# Patient Record
Sex: Female | Born: 1975 | Race: White | Hispanic: No | Marital: Married | Smoking: Never smoker
Health system: Southern US, Community
[De-identification: ages and names within clinical notes are randomized; demographics above are authoritative.]

## PROBLEM LIST (undated history)

## (undated) DIAGNOSIS — F32A Depression, unspecified: Secondary | ICD-10-CM

## (undated) DIAGNOSIS — F419 Anxiety disorder, unspecified: Secondary | ICD-10-CM

## (undated) DIAGNOSIS — F329 Major depressive disorder, single episode, unspecified: Secondary | ICD-10-CM

## (undated) DIAGNOSIS — G43909 Migraine, unspecified, not intractable, without status migrainosus: Secondary | ICD-10-CM

---

## 2014-04-07 ENCOUNTER — Observation Stay: Payer: Self-pay | Admitting: Surgery

## 2014-04-07 LAB — CBC WITH DIFFERENTIAL/PLATELET
Basophil #: 0.1 10*3/uL (ref 0.0–0.1)
Basophil %: 0.8 %
EOS ABS: 0.1 10*3/uL (ref 0.0–0.7)
EOS PCT: 1.6 %
HCT: 43.2 % (ref 35.0–47.0)
HGB: 14.1 g/dL (ref 12.0–16.0)
Lymphocyte #: 2.1 10*3/uL (ref 1.0–3.6)
Lymphocyte %: 27.1 %
MCH: 27.6 pg (ref 26.0–34.0)
MCHC: 32.6 g/dL (ref 32.0–36.0)
MCV: 85 fL (ref 80–100)
Monocyte #: 0.8 x10 3/mm (ref 0.2–0.9)
Monocyte %: 9.6 %
Neutrophil #: 4.8 10*3/uL (ref 1.4–6.5)
Neutrophil %: 60.9 %
PLATELETS: 273 10*3/uL (ref 150–440)
RBC: 5.1 10*6/uL (ref 3.80–5.20)
RDW: 15.7 % — ABNORMAL HIGH (ref 11.5–14.5)
WBC: 7.9 10*3/uL (ref 3.6–11.0)

## 2014-04-07 LAB — URINALYSIS, COMPLETE
Bilirubin,UR: NEGATIVE
Glucose,UR: NEGATIVE mg/dL (ref 0–75)
LEUKOCYTE ESTERASE: NEGATIVE
Nitrite: NEGATIVE
Ph: 5 (ref 4.5–8.0)
Protein: 30
Specific Gravity: 1.02 (ref 1.003–1.030)
Squamous Epithelial: 1
WBC UR: 2 /HPF (ref 0–5)

## 2014-04-07 LAB — COMPREHENSIVE METABOLIC PANEL
AST: 18 U/L (ref 15–37)
Albumin: 3.7 g/dL (ref 3.4–5.0)
Alkaline Phosphatase: 99 U/L
Anion Gap: 5 — ABNORMAL LOW (ref 7–16)
BUN: 9 mg/dL (ref 7–18)
Bilirubin,Total: 0.3 mg/dL (ref 0.2–1.0)
CO2: 24 mmol/L (ref 21–32)
Calcium, Total: 8.8 mg/dL (ref 8.5–10.1)
Chloride: 110 mmol/L — ABNORMAL HIGH (ref 98–107)
Creatinine: 0.82 mg/dL (ref 0.60–1.30)
EGFR (African American): >60
Glucose: 91 mg/dL (ref 65–99)
Osmolality: 276 (ref 275–301)
Potassium: 3.5 mmol/L (ref 3.5–5.1)
SGPT (ALT): 31 U/L (ref 12–78)
Sodium: 139 mmol/L (ref 136–145)
Total Protein: 8 g/dL (ref 6.4–8.2)

## 2014-04-08 LAB — CBC WITH DIFFERENTIAL/PLATELET
BASOS ABS: 0 10*3/uL (ref 0.0–0.1)
BASOS PCT: 0.5 %
Eosinophil #: 0.1 10*3/uL (ref 0.0–0.7)
Eosinophil %: 1.3 %
HCT: 37.9 % (ref 35.0–47.0)
HGB: 12.8 g/dL (ref 12.0–16.0)
LYMPHS ABS: 2.4 10*3/uL (ref 1.0–3.6)
LYMPHS PCT: 35 %
MCH: 28.2 pg (ref 26.0–34.0)
MCHC: 33.7 g/dL (ref 32.0–36.0)
MCV: 84 fL (ref 80–100)
Monocyte #: 0.7 x10 3/mm (ref 0.2–0.9)
Monocyte %: 10.5 %
NEUTROS ABS: 3.6 10*3/uL (ref 1.4–6.5)
Neutrophil %: 52.7 %
PLATELETS: 238 10*3/uL (ref 150–440)
RBC: 4.52 10*6/uL (ref 3.80–5.20)
RDW: 15.4 % — AB (ref 11.5–14.5)
WBC: 6.9 10*3/uL (ref 3.6–11.0)

## 2014-04-09 LAB — COMPREHENSIVE METABOLIC PANEL
ALBUMIN: 3 g/dL — AB (ref 3.4–5.0)
ALK PHOS: 76 U/L
ALT: 29 U/L (ref 12–78)
Anion Gap: 8 (ref 7–16)
BILIRUBIN TOTAL: 0.2 mg/dL (ref 0.2–1.0)
BUN: 5 mg/dL — AB (ref 7–18)
CHLORIDE: 108 mmol/L — AB (ref 98–107)
Calcium, Total: 8.3 mg/dL — ABNORMAL LOW (ref 8.5–10.1)
Co2: 24 mmol/L (ref 21–32)
Creatinine: 0.71 mg/dL (ref 0.60–1.30)
EGFR (African American): >60
Glucose: 79 mg/dL (ref 65–99)
Osmolality: 276 (ref 275–301)
Potassium: 3.4 mmol/L — ABNORMAL LOW (ref 3.5–5.1)
SGOT(AST): 25 U/L (ref 15–37)
Sodium: 140 mmol/L (ref 136–145)
Total Protein: 6.7 g/dL (ref 6.4–8.2)

## 2014-04-09 LAB — CBC WITH DIFFERENTIAL/PLATELET
Basophil #: 0 10*3/uL (ref 0.0–0.1)
Basophil %: 0.7 %
EOS ABS: 0.2 10*3/uL (ref 0.0–0.7)
Eosinophil %: 2.4 %
HCT: 37.6 % (ref 35.0–47.0)
HGB: 12.7 g/dL (ref 12.0–16.0)
LYMPHS ABS: 2.9 10*3/uL (ref 1.0–3.6)
Lymphocyte %: 45.7 %
MCH: 28.7 pg (ref 26.0–34.0)
MCHC: 33.9 g/dL (ref 32.0–36.0)
MCV: 85 fL (ref 80–100)
MONOS PCT: 8.5 %
Monocyte #: 0.5 x10 3/mm (ref 0.2–0.9)
Neutrophil #: 2.7 10*3/uL (ref 1.4–6.5)
Neutrophil %: 42.7 %
Platelet: 260 10*3/uL (ref 150–440)
RBC: 4.43 10*6/uL (ref 3.80–5.20)
RDW: 15.2 % — ABNORMAL HIGH (ref 11.5–14.5)
WBC: 6.4 10*3/uL (ref 3.6–11.0)

## 2014-04-10 LAB — COMPREHENSIVE METABOLIC PANEL
ALBUMIN: 2.7 g/dL — AB (ref 3.4–5.0)
ALK PHOS: 109 U/L
Anion Gap: 8 (ref 7–16)
BUN: 4 mg/dL — ABNORMAL LOW (ref 7–18)
Bilirubin,Total: 0.6 mg/dL (ref 0.2–1.0)
CREATININE: 0.78 mg/dL (ref 0.60–1.30)
Calcium, Total: 8.4 mg/dL — ABNORMAL LOW (ref 8.5–10.1)
Chloride: 108 mmol/L — ABNORMAL HIGH (ref 98–107)
Co2: 24 mmol/L (ref 21–32)
EGFR (Non-African Amer.): >60
Glucose: 116 mg/dL — ABNORMAL HIGH (ref 65–99)
Osmolality: 277 (ref 275–301)
Potassium: 3.3 mmol/L — ABNORMAL LOW (ref 3.5–5.1)
SGOT(AST): 205 U/L — ABNORMAL HIGH (ref 15–37)
SGPT (ALT): 133 U/L — ABNORMAL HIGH (ref 12–78)
Sodium: 140 mmol/L (ref 136–145)
Total Protein: 6.4 g/dL (ref 6.4–8.2)

## 2014-04-10 LAB — CBC WITH DIFFERENTIAL/PLATELET
Basophil #: 0 10*3/uL (ref 0.0–0.1)
Basophil %: 0.4 %
EOS ABS: 0 10*3/uL (ref 0.0–0.7)
EOS PCT: 0.1 %
HCT: 37.4 % (ref 35.0–47.0)
HGB: 12.8 g/dL (ref 12.0–16.0)
LYMPHS PCT: 18.6 %
Lymphocyte #: 1.8 10*3/uL (ref 1.0–3.6)
MCH: 28.5 pg (ref 26.0–34.0)
MCHC: 34.1 g/dL (ref 32.0–36.0)
MCV: 84 fL (ref 80–100)
MONO ABS: 0.9 x10 3/mm (ref 0.2–0.9)
MONOS PCT: 9.5 %
Neutrophil #: 7 10*3/uL — ABNORMAL HIGH (ref 1.4–6.5)
Neutrophil %: 71.4 %
Platelet: 260 10*3/uL (ref 150–440)
RBC: 4.47 10*6/uL (ref 3.80–5.20)
RDW: 14.9 % — AB (ref 11.5–14.5)
WBC: 9.8 10*3/uL (ref 3.6–11.0)

## 2014-04-13 LAB — PATHOLOGY REPORT

## 2015-01-30 NOTE — Op Note (Signed)
PATIENT NAME:  Janice [name], Janice [name] MR#:  [id number] DATE OF BIRTH:  [birth year]  DATE OF PROCEDURE:  00/11/2013  PREOPERATIVE DIAGNOSIS: Acute cholecystitis.   POSTOPERATIVE DIAGNOSIS: Acute cholecystitis.   PROCEDURE: Laparoscopic cholecystectomy.   SURGEON: Dionne Miloichard Loyal Rudy, M.D.   ASSISTANT: Baird CancerIsaac Stappaspas.   ANESTHESIA: General with endotracheal tube.   INDICATIONS: This is a patient with unrelenting right upper quadrant pain and a work-up showing gallstones. Preoperatively, we discussed rationale for surgery, the options of observation, risk of bleeding, infection, recurrence of symptoms, failure to resolve her symptoms, open procedure, bile duct damage, bile duct leak, retained common bile duct stone, any of which could require further surgery and/or ERCP, stent and papillotomy. This was reviewed for her and her family in the preop holding area. They understood and agreed to proceed.   FINDINGS: Mild acute cholecystitis with gallstones. There was approximately 1 cm probable hemangioma in the right superficial lobe of the liver laterally. Photos were taken. It appeared to be a hemangioma.   DESCRIPTION OF PROCEDURE: The patient was induced to general anesthesia. IV antibiotics were in place, as was VTE prophylaxis. She was prepped and draped in a sterile fashion. Marcaine was infiltrated in skin and subcutaneous tissues.  Around the periumbilical area, incision was made. Veress needle was placed. Pneumoperitoneum was obtained and a 5 mm trocar port was placed. The abdominal cavity was explored and under direct vision, a 10 mm epigastric port and 2 lateral 5 mm ports were placed. The gallbladder was placed on tension. The peritoneum over the infundibulum was incised bluntly. The cystic duct and gallbladder junction was well identified. The cystic artery was identified, doubly clipped and divided. This allowed for good visit visualization of the cystic duct as it entered the infundibulum of the  gallbladder here. It was doubly clipped and divided, and the gallbladder was taken to the gallbladder fossa with electrocautery, passed out through the epigastric port site with the aid of an Endo Catch bag.  In taking the gallbladder down, two other small branches of the cystic artery were identified and doubly clipped as well. The area was checked for hemostasis, found to be adequate. There was no sign of bleeding, bile leak, or bowel injury.   The right a lateral new lobe of the liver possessed an obvious lesion, which appeared to be hemangioma.  On close inspection, it measured approximately 1 cm, somewhat scarified, but there was visible vessels in the middle of it and photos were taken. No sign of obvious malignancy.   Once assuring that hemostasis was adequate, the camera was placed in the epigastric site to view back to the periumbilical site. There was no sign of bleeding or bowel injury. Therefore, pneumoperitoneum was released. All ports were removed. Fascial edges at the epigastric right were approximated with figure-of-eight 0 Vicryls; 4-0 subcuticular Monocryl was used on all skin edges. Steri-Strips, Mastisol, and sterile dressings were placed. The patient tolerated procedure well. There were no complications. She was taken to the recovery room in stable condition to be to be admitted for continued care.    ____________________________ Adah Salvageichard E. Excell Seltzerooper, MD rec:ts D: 04/09/2014 10:35:26 ET T: 04/09/2014 15:56:07 ET JOB#: [id number]  cc: Adah Salvageichard E. Excell Seltzerooper, MD, <Dictator> Lattie HawICHARD E Keone Kamer MD ELECTRONICALLY SIGNED 04/09/2014 17:14

## 2015-01-30 NOTE — H&P (Signed)
PATIENT NAME:  Janice [name], Janice [name] MR#:  [id number] DATE OF BIRTH:  [birth year]  DATE OF ADMISSION:  04/07/2014  PRIMARY CARE PHYSICIAN:  None.   CHIEF COMPLAINT:  Abdominal pain, nausea, diarrhea.   BRIEF HISTORY OF PRESENT ILLNESS:  Janice DingwallJennifer Mendiola is a 39 year old woman seen in the Emergency Room with 4 day history of severe substernal midepigastric, right upper quadrant and back pain associated with profuse diarrhea.  The symptoms worsened over the last 24 hours now with profound nausea and intermittent vomiting.  She denies any fever or chills.  She had a C-section performed in April of 2015 in [place], complicated by a wound hematoma and the need for transfusion.  Prior to delivery she had multiple similar symptoms, but not as severe as the current symptoms and thought she simply had indigestion with the pregnancy.  She denies a history of hepatitis, yellow jaundice, pancreatitis, peptic ulcer disease, previous diagnosis of gallbladder disease or diverticulitis.  Only previous surgery was a C-section.  She was noted to have multiple fibroids at the time of her surgery.  C-section was performed through a Pfannenstiel incision.   She denies any significant medical problems including hypertension, cardiac disease, diabetes or thyroid problems.  She does not smoke cigarettes.  Does not drink alcohol regularly.  Does not take illicit drugs.    MEDICATIONS:  She takes no medications regularly.   ALLERGIES:  ALLERGIC TO PENICILLIN WITH SIGNIFICANT TOTAL BODY SWELLING.   REVIEW OF SYSTEMS:  Otherwise unremarkable.  A 10-point review of systems was performed with the patient and is unremarkable.    Work-up in the Emergency Room demonstrated normal lab values.  She did have a slightly elevated chloride at 110.  Liver function studies were unremarkable.  Hemoglobin was 14.1.  White blood cell count was 7900.  Gallbladder ultrasound was performed which revealed evidence of impacted cystic duct stone, distended  gallbladder without gallbladder wall thickening, pericholecystic fluid or common bile duct enlargement.  The surgical service was consulted.    PHYSICAL EXAMINATION: GENERAL:  The patient's blood pressure 168/100.  Heart rate of 103.  She is afebrile.  HEENT:  No facial deformities.  No scleral icterus.  No pupillary abnormalities.  NECK:  Supple, nontender with a midline trachea.  No adenopathy.  CHEST:  Clear with no adventitious sounds.  She has normal pulmonary excursion.  CARDIAC:  No murmurs or gallops.  She seems to be in normal sinus rhythm.  ABDOMEN:  Soft, but she has marked right upper quadrant tenderness with point tenderness in the right upper quadrant.  I cannot palpate her gallbladder.  She has significant guarding with no rebound.  She has well-healed lower suprapubic scar transversely.  She has active bowel sounds.  EXTREMITIES:  Full range of motion, no deformities.  Good distal pulses.  PSYCHIATRIC:  Normal orientation, normal affect.   IMPRESSION:  This woman appears to have biliary colic, possible early cholecystitis.  We discussed the options available to her.  She would like to proceed with hospitalization, surgical intervention.  Risks, benefits, and options for surgery were outlined and accepted.  Her husband was present for the interview.     ____________________________ Quentin Orealph L. Ely III, MD rle:ea D: 04/07/2014 23:13:30 ET T: 04/07/2014 23:25:35 ET JOB#: [id number]  cc: Quentin Orealph L. Ely III, MD, <Dictator> Quentin OreALPH L ELY MD ELECTRONICALLY SIGNED 04/09/2014 20:33

## 2015-01-30 NOTE — Discharge Summary (Signed)
PATIENT NAME:  Janice [name], Janice [name] MR#:  [id number] DATE OF BIRTH:  09-Jul-1976  DATE OF ADMISSION:  04/07/2014 DATE OF DISCHARGE:  04/10/2014  DIAGNOSES: Acute cholecystitis with cholelithiasis.   PROCEDURES: Laparoscopic cholecystectomy.  HISTORY OF PRESENT ILLNESS AND HOSPITAL COURSE: This is a patient with no prior past medical history who presents with severe pain, nausea and vomiting. She was taken to the Operating Room where laparoscopic cholecystectomy was performed.  She made an uncomplicated postoperative recovery. Her liver function tests were slightly elevated but her pain is completely gone. She felt well and was tolerating a regular diet and wished to be discharged. She is discharged on oral analgesics to follow up in my office in 10 days.    ____________________________ Adah Salvageichard E. Excell Seltzerooper, MD rec:ts D: 04/10/2014 09:32:20 ET T: 04/10/2014 19:43:29 ET JOB#: [id number]  cc: Adah Salvageichard E. Excell Seltzerooper, MD, <Dictator> Lattie HawICHARD E Lindwood Mogel MD ELECTRONICALLY SIGNED 04/11/2014 9:11

## 2017-08-24 ENCOUNTER — Other Ambulatory Visit: Payer: Self-pay | Admitting: Family

## 2017-08-24 DIAGNOSIS — Z1239 Encounter for other screening for malignant neoplasm of breast: Secondary | ICD-10-CM

## 2018-06-14 ENCOUNTER — Emergency Department (HOSPITAL_COMMUNITY): Payer: BC Managed Care – PPO

## 2018-06-14 ENCOUNTER — Emergency Department (HOSPITAL_COMMUNITY)
Admission: EM | Admit: 2018-06-14 | Discharge: 2018-06-14 | Disposition: A | Discharge status: Home / Self Care | Payer: BC Managed Care – PPO | Attending: Emergency Medicine | Admitting: Emergency Medicine

## 2018-06-14 ENCOUNTER — Encounter (HOSPITAL_COMMUNITY): Payer: Self-pay

## 2018-06-14 ENCOUNTER — Other Ambulatory Visit: Payer: Self-pay

## 2018-06-14 DIAGNOSIS — Z79899 Other long term (current) drug therapy: Secondary | ICD-10-CM | POA: Diagnosis not present

## 2018-06-14 DIAGNOSIS — R51 Headache: Secondary | ICD-10-CM | POA: Diagnosis present

## 2018-06-14 DIAGNOSIS — R519 Headache, unspecified: Secondary | ICD-10-CM

## 2018-06-14 HISTORY — DX: Anxiety disorder, unspecified: F41.9

## 2018-06-14 HISTORY — DX: Migraine, unspecified, not intractable, without status migrainosus: G43.909

## 2018-06-14 HISTORY — DX: Depression, unspecified: F32.A

## 2018-06-14 HISTORY — DX: Major depressive disorder, single episode, unspecified: F32.9

## 2018-06-14 LAB — COMPREHENSIVE METABOLIC PANEL
ALK PHOS: 82 U/L (ref 38–126)
ALT: 14 U/L (ref 0–44)
ANION GAP: 10 (ref 5–15)
AST: 18 U/L (ref 15–41)
Albumin: 3.6 g/dL (ref 3.5–5.0)
BUN: 6 mg/dL (ref 6–20)
CALCIUM: 9.2 mg/dL (ref 8.9–10.3)
CO2: 24 mmol/L (ref 22–32)
Chloride: 108 mmol/L (ref 98–111)
Creatinine, Ser: 0.71 mg/dL (ref 0.44–1.00)
GFR calc non Af Amer: >60 mL/min (ref >60)
Glucose, Bld: 94 mg/dL (ref 70–99)
Potassium: 3.9 mmol/L (ref 3.5–5.1)
SODIUM: 142 mmol/L (ref 135–145)
TOTAL PROTEIN: 7 g/dL (ref 6.5–8.1)
Total Bilirubin: 0.4 mg/dL (ref 0.3–1.2)

## 2018-06-14 LAB — CBC WITH DIFFERENTIAL/PLATELET
Abs Immature Granulocytes: 0 10*3/uL (ref 0.0–0.1)
BASOS ABS: 0.1 10*3/uL (ref 0.0–0.1)
BASOS PCT: 1 %
EOS ABS: 0.2 10*3/uL (ref 0.0–0.7)
EOS PCT: 1 %
HCT: 42.3 % (ref 36.0–46.0)
HEMOGLOBIN: 13.9 g/dL (ref 12.0–15.0)
Immature Granulocytes: 0 %
LYMPHS PCT: 20 %
Lymphs Abs: 2.7 10*3/uL (ref 0.7–4.0)
MCH: 28.3 pg (ref 26.0–34.0)
MCHC: 32.9 g/dL (ref 30.0–36.0)
MCV: 86 fL (ref 78.0–100.0)
Monocytes Absolute: 0.9 10*3/uL (ref 0.1–1.0)
Monocytes Relative: 6 %
Neutro Abs: 9.8 10*3/uL — ABNORMAL HIGH (ref 1.7–7.7)
Neutrophils Relative %: 72 %
Platelets: 322 10*3/uL (ref 150–400)
RBC: 4.92 MIL/uL (ref 3.87–5.11)
RDW: 13.7 % (ref 11.5–15.5)
WBC: 13.5 10*3/uL — ABNORMAL HIGH (ref 4.0–10.5)

## 2018-06-14 LAB — I-STAT BETA HCG BLOOD, ED (MC, WL, AP ONLY): I-stat hCG, quantitative: <5 m[IU]/mL (ref <5)

## 2018-06-14 MED ORDER — BUTALBITAL-APAP-CAFFEINE 50-325-40 MG PO TABS: 1.0000 | ORAL_TABLET | Freq: Four times a day (QID) | ORAL | 0 refills | Status: AC | PRN

## 2018-06-14 MED ORDER — DIPHENHYDRAMINE HCL 50 MG/ML IJ SOLN
25.0000 mg | Freq: Once | INTRAMUSCULAR | Status: AC
  Administered 2018-06-14: 25 mg via INTRAVENOUS
  Filled 2018-06-14: qty 1

## 2018-06-14 MED ORDER — METOCLOPRAMIDE HCL 5 MG/ML IJ SOLN
10.0000 mg | Freq: Once | INTRAMUSCULAR | Status: AC
  Administered 2018-06-14: 10 mg via INTRAVENOUS
  Filled 2018-06-14: qty 2

## 2018-06-14 MED ORDER — DEXAMETHASONE SODIUM PHOSPHATE 10 MG/ML IJ SOLN
4.0000 mg | Freq: Once | INTRAMUSCULAR | Status: AC
  Administered 2018-06-14: 4 mg via INTRAVENOUS
  Filled 2018-06-14: qty 1

## 2018-06-14 MED ORDER — SODIUM CHLORIDE 0.9 % IV BOLUS
1000.0000 mL | Freq: Once | INTRAVENOUS | Status: AC
  Administered 2018-06-14: 1000 mL via INTRAVENOUS

## 2018-06-14 NOTE — Discharge Instructions (Addendum)
Take tylenol, motrin for headaches.   Take fioricet for severe headaches   See neurologist if you have persistent headaches   Return to ER if you have worse headaches, vomiting, blurry vision, dizziness, fever

## 2018-06-14 NOTE — ED Notes (Signed)
Pt tolerating sprite w/o difficulty.

## 2018-06-14 NOTE — ED Triage Notes (Signed)
[facility]- pt coming from work with complaint of severe headache. Pt was initially sobbing. Hx of migraines but states this feels worse. Described as severe throbbing. Also reports some blurred vision and sound sensitivity. 4mg  of nausea, 600mg  ibuprofen. AOX4.    180/100 initially 215/110

## 2018-06-14 NOTE — ED Notes (Signed)
Patient transported to CT 

## 2018-06-14 NOTE — ED Provider Notes (Signed)
[facility] EMERGENCY DEPARTMENT Provider Note   CSN: [id number] Arrival date & time: 06/14/18  1037     History   Chief Complaint Chief Complaint  Patient presents with  . Headache    HPI Adamary Savary is a 42 y.o. female history of anxiety, depression, migraines here presenting with headaches.  Patient states that she has history of migraines and usually improve with NSAIDs.  She had headache yesterday and took some ibuprofen and it went away.  This morning she woke up and she has sudden onset of severe 10/10 headache and felt her whole head throbbing.  She also has some dizziness and blurry vision and photophobia associated with it.  She felt nauseated as well.  Denies any fevers or neck pain.  Patient was noted to be hypertensive per EMS and was given 4 mg of Zofran for nausea.  She is not on any blood pressure medicines currently.  She has no history of subarachnoid or cerebral aneurysms.  Patient states that she has history of migraines but does not see a neurologist and this is much more severe than her usual headaches.  The history is provided by the patient.    Past Medical History:  Diagnosis Date  . Anxiety   . Depression   . Migraines     There are no active problems to display for this patient.   History reviewed. No pertinent surgical history.   OB History   None      Home Medications    Prior to Admission medications   Medication Sig Start Date End Date Taking? Authorizing Provider  cholecalciferol (VITAMIN D) 1000 units tablet Take 1,000 Units by mouth daily.   Yes [provider]  desvenlafaxine (PRISTIQ) 100 MG 24 hr tablet Take 100 mg by mouth daily.   Yes [provider]  norethindrone-ethinyl estradiol (JUNEL FE,GILDESS FE,LOESTRIN FE) 1-20 MG-MCG tablet Take 1 tablet by mouth daily.   Yes [provider]    Family History History reviewed. No pertinent family history.  Social History Social History    Tobacco Use  . Smoking status: Never Smoker  . Smokeless tobacco: Never Used  Substance Use Topics  . Alcohol use: Not Currently  . Drug use: Not on file     Allergies   Penicillins   Review of Systems Review of Systems  Eyes: Positive for photophobia.  Neurological: Positive for headaches.  All other systems reviewed and are negative.    Physical Exam Updated Vital Signs BP 136/88   Pulse 97   Temp 98.5 F (36.9 C) (Oral)   Resp 14   Ht 5' (1.524 m)   Wt 95.3 kg   LMP 06/12/2018 (Exact Date)   SpO2 98%   BMI 41.01 kg/m   Physical Exam  Constitutional: She is oriented to person, place, and time.  Uncomfortable, holding her head   HENT:  Head: Normocephalic.  Mouth/Throat: Oropharynx is clear and moist.  Eyes: Pupils are equal, round, and reactive to light.  + photophobia, no nystagmus   Neck: Normal range of motion. Neck supple.  Cardiovascular: Normal rate, regular rhythm and normal heart sounds.  Pulmonary/Chest: Effort normal and breath sounds normal.  Abdominal: Soft. Bowel sounds are normal.  Musculoskeletal: Normal range of motion.  Neurological: She is alert and oriented to person, place, and time. She has normal strength.  CN 2- 12 intact. Nl strength throughout   Skin: Skin is warm. Capillary refill takes less than 2 seconds.  Psychiatric:  She has a normal mood and affect. Her behavior is normal.  Nursing note and vitals reviewed.    ED Treatments / Results  Labs (all labs ordered are listed, but only abnormal results are displayed) Labs Reviewed  CBC WITH DIFFERENTIAL/PLATELET - Abnormal; Notable for the following components:      Result Value   WBC 13.5 (*)    Neutro Abs 9.8 (*)    All other components within normal limits  COMPREHENSIVE METABOLIC PANEL  I-STAT BETA HCG BLOOD, ED ([facility], [facility], [facility] ONLY)    EKG None  Radiology Ct Head Wo Contrast  Result Date: 06/14/2018 CLINICAL DATA:  Severe throbbing headache. Blurred vision.  History of migraines. EXAM: CT HEAD WITHOUT CONTRAST TECHNIQUE: Contiguous axial images were obtained from the base of the skull through the vertex without intravenous contrast. COMPARISON:  None. FINDINGS: Brain: No evidence of acute infarction, hemorrhage, hydrocephalus, extra-axial collection or mass lesion/mass effect. Vascular: No hyperdense vessel or unexpected calcification. Skull: Normal. Negative for fracture or focal lesion. Sinuses/Orbits: Globes and orbits are within normal limits. Visualized sinuses and mastoid air cells are clear. Other: None. IMPRESSION: Normal unenhanced CT scan of the brain. Electronically Signed   By: Amie Portland M.D.   On: 06/14/2018 13:36    Procedures Procedures (including critical care time)  Medications Ordered in ED Medications  metoCLOPramide (REGLAN) injection 10 mg (10 mg Intravenous Given 06/14/18 1347)  diphenhydrAMINE (BENADRYL) injection 25 mg (25 mg Intravenous Given 06/14/18 1346)  sodium chloride 0.9 % bolus 1,000 mL (0 mLs Intravenous Stopped 06/14/18 1445)  dexamethasone (DECADRON) injection 4 mg (4 mg Intravenous Given 06/14/18 1346)     Initial Impression / Assessment and Plan / ED Course  I have reviewed the triage vital signs and the nursing notes.  Pertinent labs & imaging results that were available during my care of the patient were reviewed by me and considered in my medical decision making (see chart for details).     Laurey Fasolino is a 42 y.o. female here with headaches. Worse headache of her life, different than previous migraines. Consider bad migraine vs SAH. Within 6 hr window so if CT head negative, will not need LP. Will get labs and give migraine cocktail.   3:33 PM CT head unremarkable. Labs unremarkable. Felt better now. Will dc home with fioricet prn, neurology follow up.    Final Clinical Impressions(s) / ED Diagnoses   Final diagnoses:  None    ED Discharge Orders    None       Charlynne Pander,  MD 06/14/18 1534

## 2018-06-14 NOTE — ED Notes (Signed)
Pt verbalized understanding of discharge instructions and denies any further questions at this time.   

## 2018-06-20 ENCOUNTER — Other Ambulatory Visit: Payer: Self-pay | Admitting: Family Medicine

## 2018-06-20 DIAGNOSIS — Z1231 Encounter for screening mammogram for malignant neoplasm of breast: Secondary | ICD-10-CM

## 2018-06-28 ENCOUNTER — Ambulatory Visit
Admission: RE | Admit: 2018-06-28 | Discharge: 2018-06-28 | Disposition: A | Discharge status: Home / Self Care | Payer: BC Managed Care – PPO | Source: Ambulatory Visit | Attending: Family Medicine | Admitting: Family Medicine

## 2018-06-28 DIAGNOSIS — Z1231 Encounter for screening mammogram for malignant neoplasm of breast: Secondary | ICD-10-CM | POA: Diagnosis present

## 2019-12-13 ENCOUNTER — Ambulatory Visit: Payer: BC Managed Care – PPO | Attending: Internal Medicine

## 2019-12-13 ENCOUNTER — Other Ambulatory Visit: Payer: Self-pay

## 2019-12-13 DIAGNOSIS — Z23 Encounter for immunization: Secondary | ICD-10-CM | POA: Insufficient documentation

## 2019-12-13 NOTE — Progress Notes (Signed)
   Covid-19 Vaccination Clinic  Name:  Natalyn Szymanowski    MRN: [id number] DOB: [birth year]  12/13/2019  Ms. Geraci was observed post Covid-19 immunization for 15 minutes without incident. She was provided with Vaccine Information Sheet and instruction to access the V-Safe system.   Ms. Peaden was instructed to call 911 with any severe reactions post vaccine: Marland Kitchen Difficulty breathing  . Swelling of face and throat  . A fast heartbeat  . A bad rash all over body  . Dizziness and weakness   Immunizations Administered    Name Date Dose VIS Date Route   Moderna COVID-19 Vaccine 12/13/2019  1:48 PM 0.5 mL 09/09/2019 Intramuscular   Manufacturer: Moderna   Lot: [id number]   NDC: 63943-200-37

## 2020-01-10 ENCOUNTER — Ambulatory Visit: Payer: BC Managed Care – PPO | Attending: Internal Medicine

## 2020-01-10 DIAGNOSIS — Z23 Encounter for immunization: Secondary | ICD-10-CM

## 2020-01-10 NOTE — Progress Notes (Signed)
   Covid-19 Vaccination Clinic  Name:  Janice [name]    MRN: [id number] DOB: 1976-04-25  01/10/2020  Janice [name] was observed post Covid-19 immunization for 15 minutes without incident. She was provided with Vaccine Information Sheet and instruction to access the V-Safe system.   Janice [name] was instructed to call 911 with any severe reactions post vaccine: Marland Kitchen Difficulty breathing  . Swelling of face and throat  . A fast heartbeat  . A bad rash all over body  . Dizziness and weakness   Immunizations Administered    Name Date Dose VIS Date Route   Moderna COVID-19 Vaccine 01/10/2020  9:55 AM 0.5 mL 09/09/2019 Intramuscular   Manufacturer: Gala Murdoch   Lot: [id number]   NDC: 58099-833-82

## 2020-06-29 IMAGING — CT CT HEAD W/O CM
4 series · 16 of 47 positions shown, 18 images · non-contrast
Comparison: None.

CLINICAL DATA: Severe throbbing headache. Blurred vision. History
of migraines.

EXAM:
CT HEAD WITHOUT CONTRAST
TECHNIQUE: Contiguous axial images were obtained from the base of the skull
through the vertex without intravenous contrast.

[Series 3: head wo · axial · 0.44mm/px · z∈[-124,-8]mm · 7 of 31 slices shown, 9 images]
[im 4/31  brain]
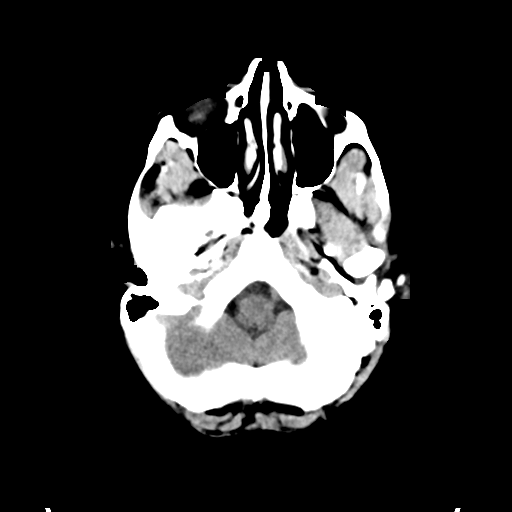
[im 4/31  bone]
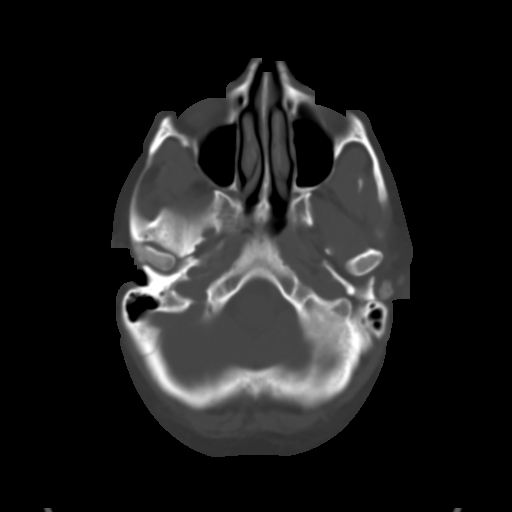
[im 8/31  brain]
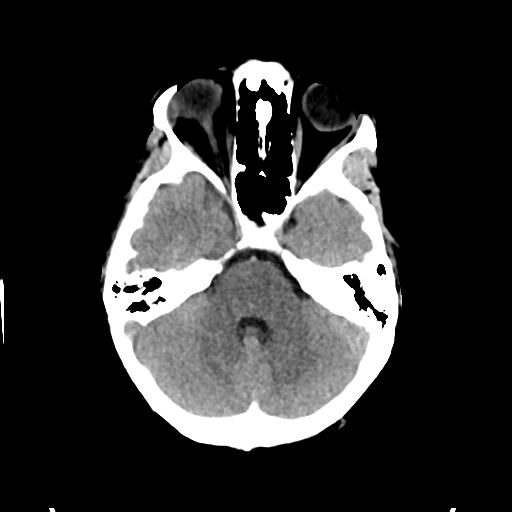
[im 12/31  brain]
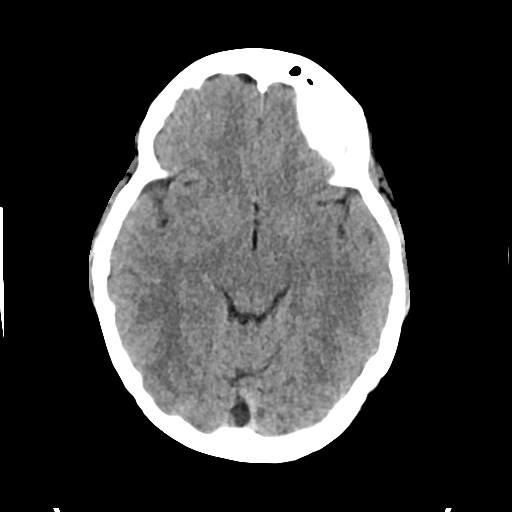
[im 16/31  brain]
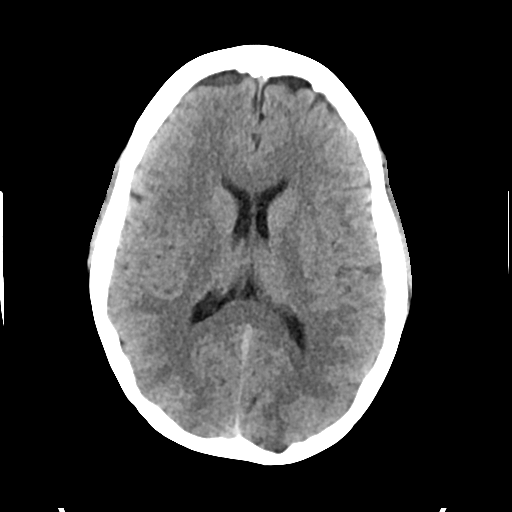
[im 19/31  brain]
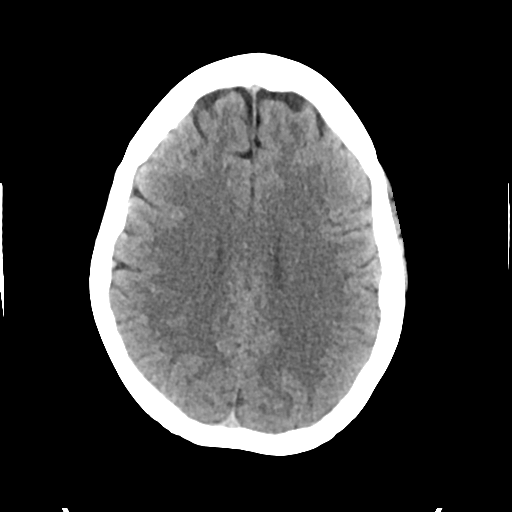
[im 19/31  bone]
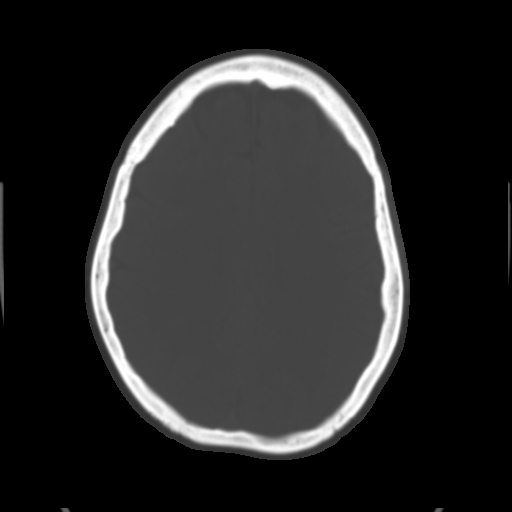
[im 23/31  brain]
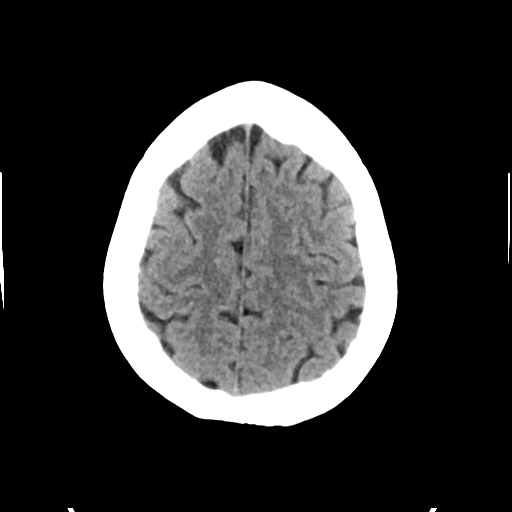
[im 27/31  brain]
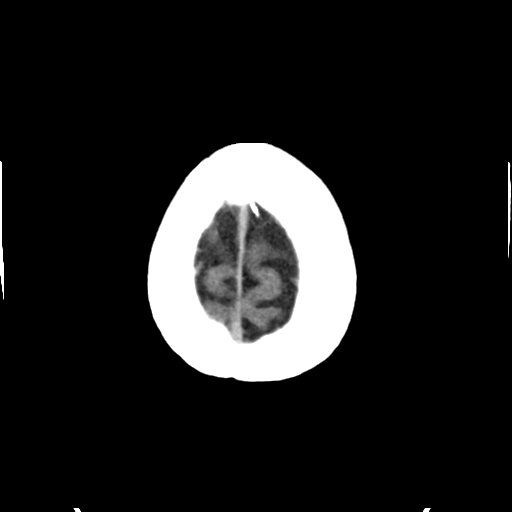

[Series 4: head bone · axial · 0.44mm/px · z∈[-124,-92]mm · 3 of 78 slices shown]
[im 8/78  bone]
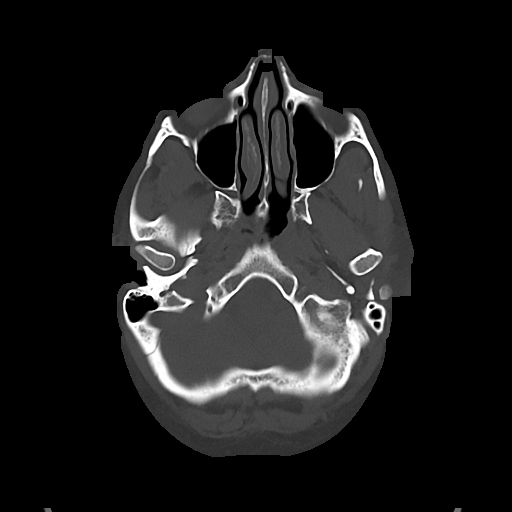
[im 16/78  bone]
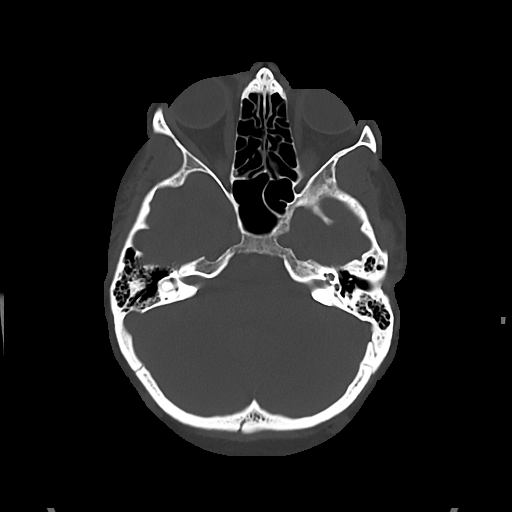
[im 24/78  bone]
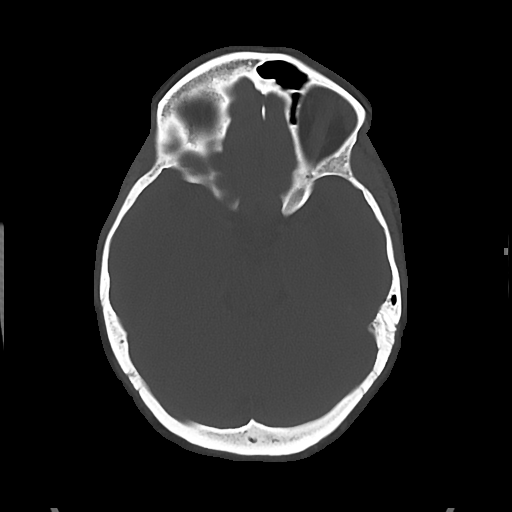

[Series 5: cor soft · coronal · 0.31mm/px · 3 of 67 slices shown]
[im 23/67  brain]
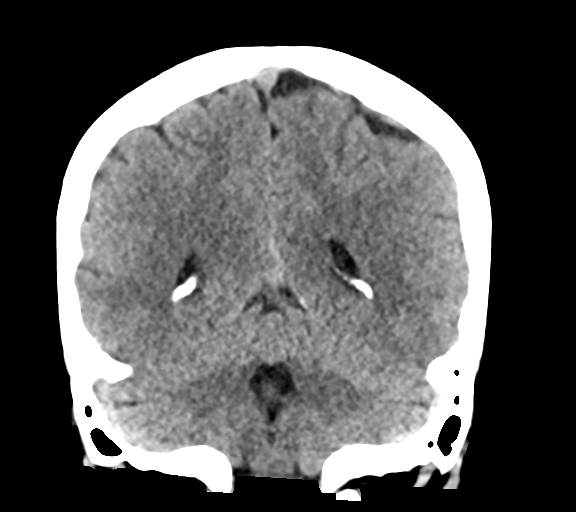
[im 30/67  brain]
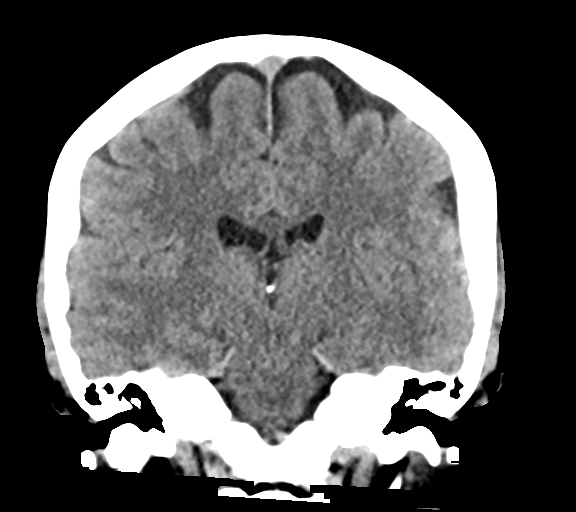
[im 37/67  brain]
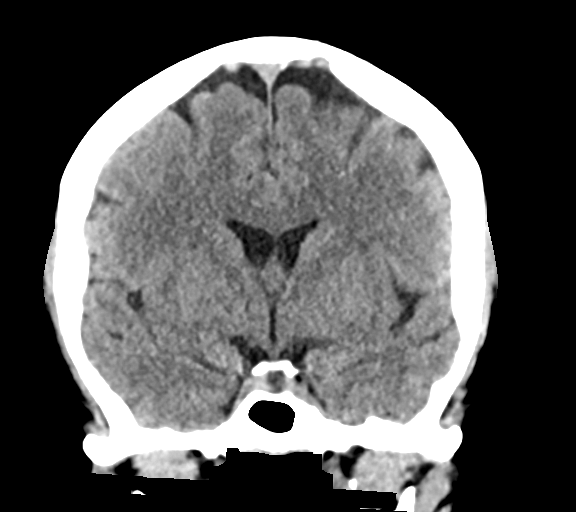

[Series 6: sag soft · sagittal · 0.30mm/px · 3 of 60 slices shown]
[im 20/60  brain]
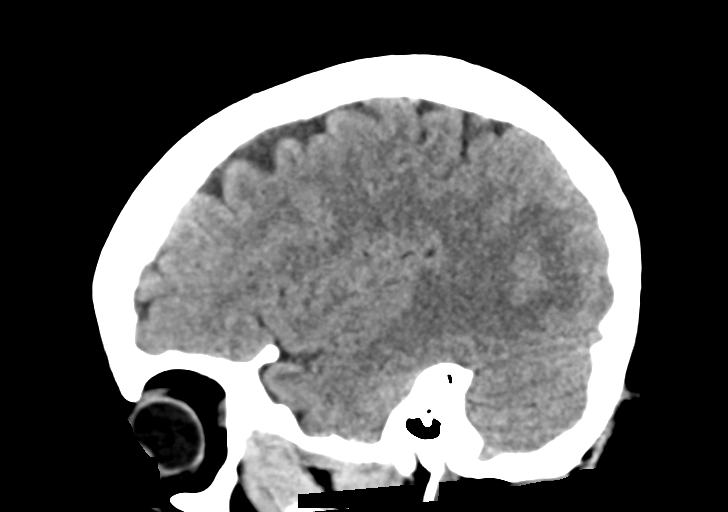
[im 30/60  brain]
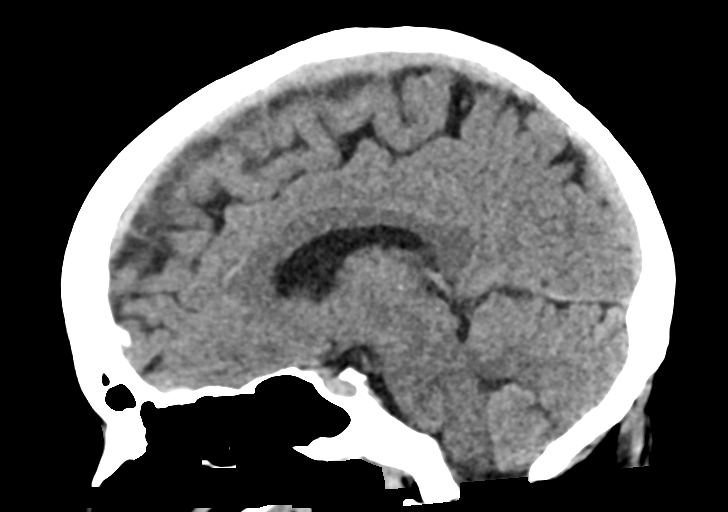
[im 40/60  brain]
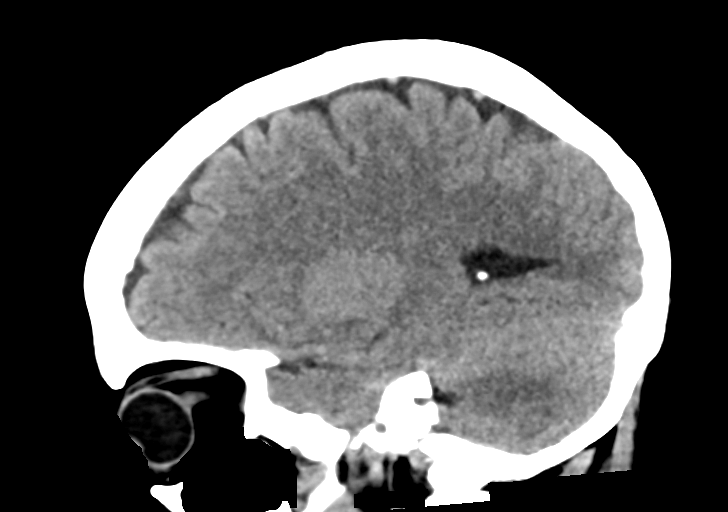

[16 of 47 positions shown; findings below may reference images not displayed]

FINDINGS: Brain: No evidence of acute infarction, hemorrhage, hydrocephalus,
extra-axial collection or mass lesion/mass effect.

Vascular: No hyperdense vessel or unexpected calcification.

Skull: Normal. Negative for fracture or focal lesion.

Sinuses/Orbits: Globes and orbits are within normal limits.
Visualized sinuses and mastoid air cells are clear.

Other: None.
IMPRESSION: Normal unenhanced CT scan of the brain.

## 2022-11-22 ENCOUNTER — Other Ambulatory Visit: Payer: Self-pay | Admitting: Internal Medicine

## 2022-11-22 DIAGNOSIS — Z1231 Encounter for screening mammogram for malignant neoplasm of breast: Secondary | ICD-10-CM

## 2022-12-08 ENCOUNTER — Ambulatory Visit
Admission: RE | Admit: 2022-12-08 | Discharge: 2022-12-08 | Disposition: A | Discharge status: Home / Self Care | Payer: BC Managed Care – PPO | Source: Ambulatory Visit | Attending: Internal Medicine | Admitting: Internal Medicine

## 2022-12-08 ENCOUNTER — Encounter: Payer: Self-pay | Admitting: Radiology

## 2022-12-08 DIAGNOSIS — Z1231 Encounter for screening mammogram for malignant neoplasm of breast: Secondary | ICD-10-CM

## 2022-12-28 ENCOUNTER — Emergency Department: Payer: BC Managed Care – PPO

## 2022-12-28 ENCOUNTER — Inpatient Hospital Stay
Admission: EM | Admit: 2022-12-28 | Discharge: 2022-12-29 | DRG: 872 | Disposition: A | Discharge status: Home / Self Care | Payer: BC Managed Care – PPO | Attending: Internal Medicine | Admitting: Internal Medicine

## 2022-12-28 ENCOUNTER — Other Ambulatory Visit: Payer: Self-pay

## 2022-12-28 ENCOUNTER — Encounter: Payer: Self-pay | Admitting: Internal Medicine

## 2022-12-28 DIAGNOSIS — R531 Weakness: Secondary | ICD-10-CM

## 2022-12-28 DIAGNOSIS — J0510 Acute epiglottitis without obstruction: Secondary | ICD-10-CM | POA: Diagnosis present

## 2022-12-28 DIAGNOSIS — F419 Anxiety disorder, unspecified: Secondary | ICD-10-CM | POA: Diagnosis present

## 2022-12-28 DIAGNOSIS — R55 Syncope and collapse: Secondary | ICD-10-CM | POA: Diagnosis present

## 2022-12-28 DIAGNOSIS — Z6841 Body Mass Index (BMI) 40.0 and over, adult: Secondary | ICD-10-CM

## 2022-12-28 DIAGNOSIS — F32A Depression, unspecified: Secondary | ICD-10-CM | POA: Diagnosis present

## 2022-12-28 DIAGNOSIS — A400 Sepsis due to streptococcus, group A: Secondary | ICD-10-CM | POA: Diagnosis present

## 2022-12-28 DIAGNOSIS — J020 Streptococcal pharyngitis: Principal | ICD-10-CM

## 2022-12-28 LAB — URINALYSIS, ROUTINE W REFLEX MICROSCOPIC
Bilirubin Urine: NEGATIVE
Glucose, UA: NEGATIVE mg/dL
Ketones, ur: NEGATIVE mg/dL
Leukocytes,Ua: NEGATIVE
Nitrite: NEGATIVE
Protein, ur: 100 mg/dL — AB
Specific Gravity, Urine: 1.02 (ref 1.005–1.030)
pH: 7.5 (ref 5.0–8.0)

## 2022-12-28 LAB — CBC
HCT: 42 % (ref 36.0–46.0)
Hemoglobin: 13.7 g/dL (ref 12.0–15.0)
MCH: 27.3 pg (ref 26.0–34.0)
MCHC: 32.6 g/dL (ref 30.0–36.0)
MCV: 83.7 fL (ref 80.0–100.0)
Platelets: 263 10*3/uL (ref 150–400)
RBC: 5.02 MIL/uL (ref 3.87–5.11)
RDW: 14.2 % (ref 11.5–15.5)
WBC: 21 10*3/uL — ABNORMAL HIGH (ref 4.0–10.5)
nRBC: 0 % (ref 0.0–0.2)

## 2022-12-28 LAB — BASIC METABOLIC PANEL
Anion gap: 10 (ref 5–15)
BUN: 10 mg/dL (ref 6–20)
CO2: 22 mmol/L (ref 22–32)
Calcium: 8.7 mg/dL — ABNORMAL LOW (ref 8.9–10.3)
Chloride: 103 mmol/L (ref 98–111)
Creatinine, Ser: 0.77 mg/dL (ref 0.44–1.00)
GFR, Estimated: >60 mL/min (ref >60)
Glucose, Bld: 146 mg/dL — ABNORMAL HIGH (ref 70–99)
Potassium: 3.7 mmol/L (ref 3.5–5.1)
Sodium: 135 mmol/L (ref 135–145)

## 2022-12-28 LAB — PREGNANCY, URINE: Preg Test, Ur: NEGATIVE

## 2022-12-28 MED ORDER — ONDANSETRON 4 MG PO TBDP: 4.0000 mg | ORAL_TABLET | Freq: Three times a day (TID) | ORAL | 0 refills | Status: AC | PRN

## 2022-12-28 MED ORDER — DEXAMETHASONE SODIUM PHOSPHATE 10 MG/ML IJ SOLN
10.0000 mg | Freq: Once | INTRAMUSCULAR | Status: AC
  Administered 2022-12-28: 10 mg via INTRAVENOUS
  Filled 2022-12-28: qty 1

## 2022-12-28 MED ORDER — KETOROLAC TROMETHAMINE 30 MG/ML IJ SOLN
15.0000 mg | Freq: Once | INTRAMUSCULAR | Status: AC
  Administered 2022-12-28: 15 mg via INTRAVENOUS
  Filled 2022-12-28: qty 1

## 2022-12-28 MED ORDER — OXYCODONE HCL 5 MG PO TABS: 5.0000 mg | ORAL_TABLET | ORAL | Status: DC | PRN

## 2022-12-28 MED ORDER — ACETAMINOPHEN 650 MG RE SUPP
650.0000 mg | Freq: Four times a day (QID) | RECTAL | Status: DC | PRN
  Administered 2022-12-28: 650 mg via RECTAL
  Filled 2022-12-28: qty 1

## 2022-12-28 MED ORDER — ONDANSETRON HCL 4 MG/2ML IJ SOLN
4.0000 mg | Freq: Once | INTRAMUSCULAR | Status: AC
  Administered 2022-12-28: 4 mg via INTRAVENOUS
  Filled 2022-12-28: qty 2

## 2022-12-28 MED ORDER — FENTANYL CITRATE PF 50 MCG/ML IJ SOSY
50.0000 ug | PREFILLED_SYRINGE | Freq: Once | INTRAMUSCULAR | Status: AC
  Administered 2022-12-28: 50 ug via INTRAVENOUS
  Filled 2022-12-28: qty 1

## 2022-12-28 MED ORDER — CLINDAMYCIN PHOSPHATE 600 MG/50ML IV SOLN
600.0000 mg | Freq: Three times a day (TID) | INTRAVENOUS | Status: DC
  Administered 2022-12-28 – 2022-12-29 (×3): 600 mg via INTRAVENOUS
  Filled 2022-12-28 (×5): qty 50

## 2022-12-28 MED ORDER — ACETAMINOPHEN 325 MG PO TABS
ORAL_TABLET | ORAL | Status: AC
  Filled 2022-12-28: qty 2

## 2022-12-28 MED ORDER — CLINDAMYCIN PHOSPHATE 600 MG/50ML IV SOLN
600.0000 mg | Freq: Once | INTRAVENOUS | Status: AC
  Administered 2022-12-28: 600 mg via INTRAVENOUS
  Filled 2022-12-28 (×2): qty 50

## 2022-12-28 MED ORDER — ACETAMINOPHEN 325 MG PO TABS
650.0000 mg | ORAL_TABLET | Freq: Four times a day (QID) | ORAL | Status: DC | PRN
  Administered 2022-12-29: 650 mg via ORAL
  Filled 2022-12-28 (×2): qty 2

## 2022-12-28 MED ORDER — SODIUM CHLORIDE 0.9 % IV SOLN: INTRAVENOUS | Status: DC

## 2022-12-28 MED ORDER — IOHEXOL 300 MG/ML  SOLN
100.0000 mL | Freq: Once | INTRAMUSCULAR | Status: AC | PRN
  Administered 2022-12-28: 100 mL via INTRAVENOUS

## 2022-12-28 MED ORDER — LACTATED RINGERS IV SOLN: INTRAVENOUS | Status: DC

## 2022-12-28 MED ORDER — ONDANSETRON HCL 4 MG PO TABS: 4.0000 mg | ORAL_TABLET | Freq: Four times a day (QID) | ORAL | Status: DC | PRN

## 2022-12-28 MED ORDER — DEXAMETHASONE SODIUM PHOSPHATE 4 MG/ML IJ SOLN
4.0000 mg | Freq: Four times a day (QID) | INTRAMUSCULAR | Status: DC
  Administered 2022-12-28 – 2022-12-29 (×3): 4 mg via INTRAVENOUS
  Filled 2022-12-28 (×5): qty 1

## 2022-12-28 MED ORDER — LACTATED RINGERS IV BOLUS
1000.0000 mL | Freq: Once | INTRAVENOUS | Status: AC
  Administered 2022-12-28: 1000 mL via INTRAVENOUS

## 2022-12-28 MED ORDER — ONDANSETRON HCL 4 MG/2ML IJ SOLN: 4.0000 mg | Freq: Four times a day (QID) | INTRAMUSCULAR | Status: DC | PRN

## 2022-12-28 MED ORDER — ACETAMINOPHEN 325 MG PO TABS
325.0000 mg | ORAL_TABLET | Freq: Once | ORAL | Status: AC
  Administered 2022-12-28: 325 mg via ORAL
  Filled 2022-12-28: qty 1

## 2022-12-28 MED ORDER — ENOXAPARIN SODIUM 60 MG/0.6ML IJ SOSY
50.0000 mg | PREFILLED_SYRINGE | INTRAMUSCULAR | Status: DC
  Administered 2022-12-28: 50 mg via SUBCUTANEOUS
  Filled 2022-12-28 (×2): qty 0.6

## 2022-12-28 MED ORDER — ACETAMINOPHEN 325 MG PO TABS
650.0000 mg | ORAL_TABLET | Freq: Once | ORAL | Status: AC
  Administered 2022-12-28: 650 mg via ORAL

## 2022-12-28 MED ORDER — KETOROLAC TROMETHAMINE 30 MG/ML IJ SOLN
30.0000 mg | Freq: Four times a day (QID) | INTRAMUSCULAR | Status: DC | PRN
  Administered 2022-12-28 – 2022-12-29 (×3): 30 mg via INTRAVENOUS
  Filled 2022-12-28 (×3): qty 1

## 2022-12-28 MED ORDER — IBUPROFEN 100 MG/5ML PO SUSP
800.0000 mg | Freq: Once | ORAL | Status: AC
  Administered 2022-12-28: 800 mg via ORAL
  Filled 2022-12-28 (×4): qty 40

## 2022-12-28 NOTE — ED Notes (Signed)
Pt ambulated to restroom. States she thinks fever breaking

## 2022-12-28 NOTE — ED Triage Notes (Signed)
Pt to ED from [facility] for throat and ear pain with body aches. Pt. Tested pos for strep. Per [facility] pt. Was tachy and had near syncopal episode.

## 2022-12-28 NOTE — Plan of Care (Signed)
  Problem: Activity: Goal: Risk for activity intolerance will decrease Outcome: Progressing   

## 2022-12-28 NOTE — ED Provider Notes (Signed)
-----------------------------------------   5:13 PM on 12/28/2022 ----------------------------------------- Patient care assumed from Dr. Charna Archer.  Patient CT scan is consistent with pharyngitis/tonsillitis along with edema of the epiglottis and hypopharynx.  Given the patient's complaint of worsening trouble swallowing I spoke with Dr. Richardson Landry of ENT who has been down to the emergency department and performed a bedside fiberoptic scope showing swelling of the supraglottic space and epiglottis.  Patient is handling secretions and breathing without difficulty however given the swelling of the epiglottis and reported difficulty swallowing we will start the patient on IV clindamycin given her penicillin allergy, IV Decadron.  Will admit to the hospital service for ongoing monitoring to ensure improvement with IV medications.   Harvest Dark, MD 12/28/22 1714

## 2022-12-28 NOTE — Progress Notes (Signed)
       CROSS COVER NOTE  NAME: Janice [name] MRN: [id number] DOB : 14-Jul-1976    HPI/Events of Note   Report: Told by RN MEWS 3 on arrival to room 202 secondary to levated heart rate, not new, and axillary temp 100.9. Tylenol had been given  On review of chart: Group A streptococcus + in clinic who sent her to  ER with temp 101. 2 F and tachycardia. Creat  0.77  potassium 3.7, na 35, IV fluids NS 100/h  Assessment and  Interventions   Assessment: Request of rectal temp with repeat vitals 101.9 F. HR 114, BP 41/60 Plan: Liquid motrin 800 po x1 Chnge IV fluids LR 125      Kathlene Cote NP [facility]

## 2022-12-28 NOTE — ED Notes (Signed)
URI/Sore throat, pos strep test

## 2022-12-28 NOTE — H&P (Signed)
History and Physical    Patient: Janice [name] [id number] DOB: 02-21-1976 DOA: 12/28/2022 DOS: the patient was seen and examined on 12/28/2022 PCP: Rusty Aus, MD  Patient coming from:  [facility] clinic  Chief Complaint:  Chief Complaint  Patient presents with   Generalized Body Aches    Pt to ED from [facility] for throat and ear pain with body aches. Pt. Tested pos for strep. Per [place] pt. Was tachy and had near syncopal episode.   Near Syncope   HPI: Janice [name] is a 47 y.o. female with medical history significant of anxiety/depression on, obesity comes to the emergency room from [facility] clinic after she was found to have high-grade fever of 101.3, tachycardia and sore throat. She tested positive for group A streptococcus throat infection. In the ER she was seen by ENT Dr. Richardson Landry. Laryngoscope revealed Epiglotittis/pharyngitis. Patient received dose of Decadron IV and IV clindamycin. She is being admitted with sepsis secondary to group a streptococcal infection patient had fever 102.3, tachycardia, elevated white count and positive group A strep throat. Review of Systems: As mentioned in the history of present illness. All other systems reviewed and are negative. Past Medical History:  Diagnosis Date   Anxiety    Depression    Migraines    No past surgical history on file. Social History:  reports that she has never smoked. She has never used smokeless tobacco. She reports that she does not currently use alcohol. No history on file for drug use.  Allergies  Allergen Reactions   Penicillins Swelling    Has patient had a PCN reaction causing immediate rash, facial/tongue/throat swelling, SOB or lightheadedness with hypotension: No Has patient had a PCN reaction causing severe rash involving mucus membranes or skin necrosis: Yes Has patient had a PCN reaction that required hospitalization: No Has patient had a PCN reaction occurring within the last 10 years: No If all of the  above answers are "NO", then may proceed with Cephalosporin use.     Family History  Problem Relation Age of Onset   Breast cancer Cousin     Prior to Admission medications   Medication Sig Start Date End Date Taking? Authorizing Provider  clindamycin (CLEOCIN) 300 MG capsule Take 300 mg by mouth 3 (three) times daily. 12/28/22  Yes [provider]  guaiFENesin-codeine 100-10 MG/5ML syrup Take 5 mLs by mouth every 6 (six) hours as needed for cough. 12/28/22  Yes [provider]  ondansetron (ZOFRAN-ODT) 4 MG disintegrating tablet Take 1 tablet (4 mg total) by mouth every 8 (eight) hours as needed for nausea or vomiting. 12/28/22  Yes Blake Divine, MD  predniSONE (DELTASONE) 20 MG tablet Take 20 mg by mouth 2 (two) times daily with a meal. For 5 days 12/28/22 01/02/23 Yes [provider]  Vitamin D, Ergocalciferol, 50000 units CAPS Take 1 capsule by mouth once a week. 11/25/22  Yes [provider]  cholecalciferol (VITAMIN D) 1000 units tablet Take 1,000 Units by mouth daily.    [provider]  desvenlafaxine (PRISTIQ) 100 MG 24 hr tablet Take 100 mg by mouth daily.    [provider]  desvenlafaxine (PRISTIQ) 50 MG 24 hr tablet Take 50 mg by mouth daily.    [provider]  norethindrone-ethinyl estradiol (JUNEL FE,GILDESS FE,LOESTRIN FE) 1-20 MG-MCG tablet Take 1 tablet by mouth daily.    [provider]    Physical Exam: Vitals:   12/28/22 1330 12/28/22 1403 12/28/22 1457 12/28/22 1530  BP: Marland Kitchen)  141/69     Pulse: (!) 125 (!) 128 (!) 122 (!) 122  Resp: 20     Temp:  (!) 100.7 F (38.2 C)    TempSrc:  Oral    SpO2: 93%  95% 96%  Weight:      Height:       Physical Exam Constitutional:      Appearance: Normal appearance. She is obese.  HENT:     Head: Normocephalic and atraumatic.     Mouth/Throat:     Mouth: Mucous membranes are dry.     Comments: Mild erythema Cardiovascular:     Rate and Rhythm:  Tachycardia present.     Pulses: Normal pulses.     Heart sounds: Normal heart sounds.  Pulmonary:     Effort: Pulmonary effort is normal.     Breath sounds: Normal breath sounds.  Skin:    General: Skin is warm and dry.  Neurological:     General: No focal deficit present.     Mental Status: She is alert. Mental status is at baseline.  Psychiatric:        Mood and Affect: Mood normal.   ' Janice [name] is a 47 y.o. female with medical history significant of anxiety/depression on, obesity comes to the emergency room from [facility] clinic after she was found to have high-grade fever of 101.3, tachycardia and sore throat. She tested positive for group A streptococcus throat infection.  Sepsis secondary to group A streptococcus/epiglottis -- admit to telemetry medical -- IV fluids -- NPO accepts ice chips -- IV clindamycin, IV Decadron -- Tylenol PRN, IV Toradol PRN -- patient was seen by ENT Dr. Richardson Landry. Direct laryngoscope showed bedside fiberoptic scope showing swelling of the supraglottic space and epiglottis  -- patient is able to hold upper secretions. No respiratory distress. Sats hundred percent on room air  Leukocytosis -- monitor WBC count  Anxiety depression -- resume home meds when able to swallow      Advance Care Planning:   Code Status: Full Code d/w pt  Consults: ENT dr Richardson Landry  Family Communication: none in the ER  Severity of Illness: The appropriate patient status for this patient is INPATIENT. Inpatient status is judged to be reasonable and necessary in order to provide the required intensity of service to ensure the patient's safety. The patient's presenting symptoms, physical exam findings, and initial radiographic and laboratory data in the context of their chronic comorbidities is felt to place them at high risk for further clinical deterioration. Furthermore, it is not anticipated that the patient will be medically stable for discharge from the  hospital within 2 midnights of admission.   * I certify that at the point of admission it is my clinical judgment that the patient will require inpatient hospital care spanning beyond 2 midnights from the point of admission due to high intensity of service, high risk for further deterioration and high frequency of surveillance required.*  Author: Fritzi Mandes, MD 12/28/2022 5:29 PM  For on call review [email or web address].

## 2022-12-28 NOTE — ED Notes (Signed)
Pt took the first of her px antibiotics at bedside.

## 2022-12-28 NOTE — Consult Note (Signed)
Janice [name], Janice [name] [id number] 1976-06-28 Janice Nearing, MD  Reason for Consult: Epiglottitis and pharyngitis Requesting Physician: Harvest Dark, MD Consulting Physician: Janice [name]  HPI: This 47 y.o. year old female was admitted on 12/28/2022 for near syncope. 47 y.o. female with past medical history of migraines, anxiety, and depression who presents to the ED complaining of fever and malaise.  Patient reports that she has been feeling increasingly ill over the past 24 hours with sore throat, malaise, nausea, vomiting, cough, body aches, and mild difficulty breathing.  She has not taken her temperature at home but reports feeling hot.  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  She presented to the walk-in clinic earlier today, found to be very lightheaded with a near syncopal episode, was subsequently referred to the ED for further evaluation.  She was found to have a positive strep test and elevated white count of 21,000 in the emergency room.  CT showed no evidence of any abscess but the epiglottis was noted to be thickened and swollen.  She has been having difficulty swallowing.  She has felt a little better since getting medicines here at the emergency room however.   Medications: (Not in a hospital admission) .  Current Facility-Administered Medications  Medication Dose Route Frequency Provider Last Rate Last Admin   clindamycin (CLEOCIN) IVPB 600 mg  600 mg Intravenous Once Harvest Dark, MD       dexamethasone (DECADRON) injection 10 mg  10 mg Intravenous Once Harvest Dark, MD       fentaNYL (SUBLIMAZE) injection 50 mcg  50 mcg Intravenous Once Harvest Dark, MD       Current Outpatient Medications  Medication Sig Dispense Refill   ondansetron (ZOFRAN-ODT) 4 MG disintegrating tablet Take 1 tablet (4 mg total) by mouth every 8 (eight) hours as needed for nausea or vomiting. 12 tablet 0   cholecalciferol (VITAMIN D) 1000 units tablet  Take 1,000 Units by mouth daily.     desvenlafaxine (PRISTIQ) 100 MG 24 hr tablet Take 100 mg by mouth daily.     norethindrone-ethinyl estradiol (JUNEL FE,GILDESS FE,LOESTRIN FE) 1-20 MG-MCG tablet Take 1 tablet by mouth daily.      PMH:  Past Medical History:  Diagnosis Date   Anxiety    Depression    Migraines     Fam Hx:  Family History  Problem Relation Age of Onset   Breast cancer Cousin     Soc Hx:  Social History   Socioeconomic History   Marital status: Married    Spouse name: Not on file   Number of children: Not on file   Years of education: Not on file   Highest education level: Not on file  Occupational History   Not on file  Tobacco Use   Smoking status: Never   Smokeless tobacco: Never  Substance and Sexual Activity   Alcohol use: Not Currently   Drug use: Not on file   Sexual activity: Not on file  Other Topics Concern   Not on file  Social History Narrative   Not on file   Social Determinants of Health   Financial Resource Strain: Not on file  Food Insecurity: Not on file  Transportation Needs: Not on file  Physical Activity: Not on file  Stress: Not on file  Social Connections: Not on file  Intimate Partner Violence: Not on file    PSH: No past surgical history on file.. Procedures since admission: No  admission procedures for hospital encounter.  ROS: Review of systems normal other than 12 systems except per HPI.  PHYSICAL EXAM Vitals:  Vitals:   12/28/22 1457 12/28/22 1530  BP:    Pulse: (!) 122 (!) 122  Resp:    Temp:    SpO2: 95% 96%  . General: Well-developed, Well-nourished in no acute distress.  She is resting comfortably and a semireclined position with no labored breathing. Mood: Mood and affect well adjusted, pleasant and cooperative. Orientation: Grossly alert and oriented. Vocal Quality: No hoarseness. Communicates verbally.  No stridor. head and Face: NCAT. No facial asymmetry. No visible skin lesions. No significant  facial scars. No tenderness with sinus percussion. Facial strength normal and symmetric. Ears: External ears with normal landmarks, no lesions. External auditory canals free of infection, cerumen impaction or lesions. Tympanic membranes intact with good landmarks and normal mobility on pneumatic otoscopy. No middle ear effusion. Hearing: Speech reception grossly normal. Nose: External nose normal with midline dorsum and no lesions or deformity. Nasal Cavity reveals essentially midline septum with normal inferior turbinates. No significant mucosal congestion or erythema. Nasal secretions are minimal and clear. No polyps seen on anterior rhinoscopy. Oral Cavity/ Oropharynx: Lips are normal with no lesions. Teeth no frank dental caries. Gingiva healthy with no lesions or gingivitis. Oropharynx i is notable for some erythema without exudate.  No edema of the palate or uvula.  Tongue and floor mouth are unremarkable.  Indirect Laryngoscopy/Nasopharyngoscopy: Visualization of the larynx, hypopharynx and nasopharynx is not possible in this setting with routine examination. Neck: Supple and symmetric with tenderness in the upper neck where there are noted enlarged jugulodigastric nodes more prominent on the left than the right. The trachea is midline. Thyroid gland is soft, nontender and symmetric with no masses or enlargement. Parotid and submandibular glands are soft, nontender and symmetric, without masses. Respiratory: Normal respiratory effort without labored breathing. Cardiovascular: Carotid pulse shows regular rate and rhythm Neurologic: Cranial Nerves II through XII are grossly intact. Eyes: Gaze and Ocular Motility are grossly normal. PERRLA. No visible nystagmus.  MEDICAL DECISION MAKING: Data Review:  Results for orders placed or performed during the hospital encounter of 12/28/22 (from the past 48 hour(s))  Basic metabolic panel     Status: Abnormal   Collection Time: 12/28/22 10:43 AM  Result  Value Ref Range   Sodium 135 135 - 145 mmol/L   Potassium 3.7 3.5 - 5.1 mmol/L   Chloride 103 98 - 111 mmol/L   CO2 22 22 - 32 mmol/L   Glucose, Bld 146 (H) 70 - 99 mg/dL    Comment: Glucose reference range applies only to samples taken after fasting for at least 8 hours.   BUN 10 6 - 20 mg/dL   Creatinine, Ser 0.77 0.44 - 1.00 mg/dL   Calcium 8.7 (L) 8.9 - 10.3 mg/dL   GFR, Estimated >60 >60 mL/min    Comment: (NOTE) Calculated using the CKD-EPI Creatinine Equation (2021)    Anion gap 10 5 - 15    Comment: Performed at [facility], [address] [place], [place] [address]  CBC     Status: Abnormal   Collection Time: 12/28/22 10:43 AM  Result Value Ref Range   WBC 21.0 (H) 4.0 - 10.5 K/uL   RBC 5.02 3.87 - 5.11 MIL/uL   Hemoglobin 13.7 12.0 - 15.0 g/dL   HCT 42.0 36.0 - 46.0 %   MCV 83.7 80.0 - 100.0 fL   MCH 27.3 26.0 - 34.0  pg   MCHC 32.6 30.0 - 36.0 g/dL   RDW 14.2 11.5 - 15.5 %   Platelets 263 150 - 400 K/uL   nRBC 0.0 0.0 - 0.2 %    Comment: Performed at [facility], [address] [place], [place] [address]  Urinalysis, Routine w reflex microscopic -Urine, Clean Catch     Status: Abnormal   Collection Time: 12/28/22 11:48 AM  Result Value Ref Range   Color, Urine YELLOW YELLOW   APPearance CLEAR (A) CLEAR   Specific Gravity, Urine 1.020 1.005 - 1.030   pH 7.5 5.0 - 8.0   Glucose, UA NEGATIVE NEGATIVE mg/dL   Hgb urine dipstick MODERATE (A) NEGATIVE   Bilirubin Urine NEGATIVE NEGATIVE   Ketones, ur NEGATIVE NEGATIVE mg/dL   Protein, ur 100 (A) NEGATIVE mg/dL   Nitrite NEGATIVE NEGATIVE   Leukocytes,Ua NEGATIVE NEGATIVE   RBC / HPF 21-50 0 - 5 RBC/hpf   WBC, UA 0-5 0 - 5 WBC/hpf   Bacteria, UA RARE (A) NONE SEEN   Squamous Epithelial / HPF 0-5 0 - 5 /HPF   Mucus PRESENT    Hyaline Casts, UA PRESENT     Comment: Performed at [facility], [address] [place], [place] [address]  Pregnancy, urine     Status: None   Collection  Time: 12/28/22 11:48 AM  Result Value Ref Range   Preg Test, Ur NEGATIVE NEGATIVE    Comment: Performed at [facility], [address] [place],  [address]  . CT Soft Tissue Neck W Contrast  Result Date: 12/28/2022 CLINICAL DATA:  Epiglottitis or tonsillitis suspected. Generalized body aches and throat pain. EXAM: CT NECK WITH CONTRAST TECHNIQUE: Multidetector CT imaging of the neck was performed using the standard protocol following the bolus administration of intravenous contrast. RADIATION DOSE REDUCTION: This exam was performed according to the departmental dose-optimization program which includes automated exposure control, adjustment of the mA and/or kV according to patient size and/or use of iterative reconstruction technique. CONTRAST:  186mL OMNIPAQUE IOHEXOL 300 MG/ML  SOLN COMPARISON:  None Available. FINDINGS: Pharynx and larynx: Edema of the right-greater-than-left palatine tonsils with associated edema of the epiglottis and hypopharynx. The upper airway is narrowed but patent. No organized fluid collection. Salivary glands: No inflammation, mass, or stone. Thyroid: Normal. Lymph nodes: Mildly prominent bilateral level 2 and 3 lymph nodes, likely reactive. Vascular: Unremarkable. Limited intracranial: Unremarkable. Visualized orbits: Normal. Mastoids and visualized paranasal sinuses: Well aerated. Skeleton: No suspicious bone lesions. Upper chest: Unremarkable. Other: None. IMPRESSION: 1. Findings consistent with pharyngitis/tonsillitis with edema of the epiglottis and hypopharynx. The upper airway is narrowed but patent. No organized fluid collection. 2. Mildly prominent bilateral level 2 and 3 lymph nodes, likely reactive. Electronically Signed   By: Emmit Alexanders M.D.   On: 12/28/2022 16:29   DG Chest 2 View  Result Date: 12/28/2022 CLINICAL DATA:  Fever, shortness of breath. EXAM: CHEST - 2 VIEW COMPARISON:  None Available. FINDINGS: The heart size and mediastinal  contours are within normal limits. Both lungs are clear. The visualized skeletal structures are unremarkable. IMPRESSION: No active cardiopulmonary disease. Electronically Signed   By: Marijo Conception M.D.   On: 12/28/2022 13:52  .   PROCEDURE: Procedure: Diagnostic Fiberoptic Nasolaryngoscopy Diagnosis: Pharyngitis and epiglottitis Indications: Patient with sore throat and findings on CT suggesting epiglottitis Findings: The nasal cavity was clear with scant clear secretions.  The nasopharynx is unremarkable.  In the hypopharynx there is mucosal edema notable  involving the epiglottis and aryepiglottic folds but the vocal cords are visualized and are clear and mobile.  There is mild narrowing of the supraglottic airway from the edema.  Erythema is minimal.  No exudates are seen. Description of Procedure: After discussing procedure and risks  (primarily nose bleed) with the patient, the nose was anesthetized with topical Lidocaine 4% and decongested with phenylephrine. A flexible fiberoptic scope was passed through the nasal cavity. The nasal cavity was inspected and the scope passed through the Nasopharynx to the region of the hypopharynx and larynx. The patient was instructed to phonate to assess vocal cord mobility. The tongue was extended to evaluate the tongue base completely. Valsalva was performed to insufflate the hypopharynx for improved examination. Findings are as noted above. The scope was withdrawn. The patient tolerated the procedure well.  ASSESSMENT: Streptococcal pharyngitis with associated supraglottic edema.  Patient appears to have a patent airway and no labored breathing at this time but it is going to be best to admit her for IV antibiotics and steroids and observation with pulse oximetry.  PLAN: She will be admitted to the medicine service for IV antibiotics and steroids.  Hopefully she will show rapid improvement with IV antibiotics and steroids (Decadron 8mg  IV q8 hours),  potentially with discharge tomorrow if she makes good progress and is able to swallow by tomorrow.  Because of the airway edema it is recommended that she be monitored with pulse oximetry preferably near the nurses station.  Janice Nearing, MD 12/28/2022 5:06 PM

## 2022-12-28 NOTE — ED Notes (Signed)
Pt. Unable to sign MSE waiver, verbal obtained. EG

## 2022-12-28 NOTE — ED Notes (Signed)
Patient was assisted to a seat from the wheelchair. Patient was able to bear weight and independently transfer.

## 2022-12-28 NOTE — ED Provider Notes (Addendum)
[facility] Provider Note    Event Date/Time   First MD Initiated Contact with Patient 12/28/22 1140     (approximate)   History   Chief Complaint Generalized Body Aches (Pt to ED from [facility] for throat and ear pain with body aches. Pt. Tested pos for strep. Per [place] pt. Was tachy and had near syncopal episode.) and Near Syncope   HPI  Janice [name] is a 47 y.o. female with past medical history of migraines, anxiety, and depression who presents to the ED complaining of fever and malaise.  Patient reports that she has been feeling increasingly ill over the past 24 hours with sore throat, malaise, nausea, vomiting, cough, body aches, and mild difficulty breathing.  She has not taken her temperature at home but reports feeling hot.  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  She presented to the walk-in clinic earlier today, found to be very lightheaded with a near syncopal episode, was subsequently referred to the ED for further evaluation.     Physical Exam   Triage Vital Signs: ED Triage Vitals  Enc Vitals Group     BP 12/28/22 1032 (!) 151/81     Pulse Rate 12/28/22 1032 (!) 130     Resp 12/28/22 1032 20     Temp 12/28/22 1032 (!) 101.3 F (38.5 C)     Temp Source 12/28/22 1032 Oral     SpO2 12/28/22 1032 100 %     Weight 12/28/22 1034 217 lb (98.4 kg)     Height 12/28/22 1034 5' (1.524 m)     Head Circumference --      Peak Flow --      Pain Score 12/28/22 1034 8     Pain Loc --      Pain Edu? --      Excl. in [place]? --     Most recent vital signs: Vitals:   12/28/22 1222 12/28/22 1403  BP: (!) 142/68   Pulse: (!) 130 (!) 128  Resp: 20   Temp: (!) 102.3 F (39.1 C) (!) 100.7 F (38.2 C)  SpO2: 95%     Constitutional: Alert and oriented. Eyes: Conjunctivae are normal. Head: Atraumatic. Nose: No congestion/rhinnorhea. Mouth/Throat: Mucous membranes are moist.  Posterior oropharynx with erythema and edema  bilaterally, no exudates or uvular deviation noted. Cardiovascular: Normal rate, regular rhythm. Grossly normal heart sounds.  2+ radial pulses bilaterally. Respiratory: Normal respiratory effort.  No retractions. Lungs CTAB. Gastrointestinal: Soft and nontender. No distention. Musculoskeletal: No lower extremity tenderness nor edema.  Neurologic:  Normal speech and language. No gross focal neurologic deficits are appreciated.    ED Results / Procedures / Treatments   Labs (all labs ordered are listed, but only abnormal results are displayed) Labs Reviewed  BASIC METABOLIC PANEL - Abnormal; Notable for the following components:      Result Value   Glucose, Bld 146 (*)    Calcium 8.7 (*)    All other components within normal limits  CBC - Abnormal; Notable for the following components:   WBC 21.0 (*)    All other components within normal limits  URINALYSIS, ROUTINE W REFLEX MICROSCOPIC - Abnormal; Notable for the following components:   APPearance CLEAR (*)    Hgb urine dipstick MODERATE (*)    Protein, ur 100 (*)    Bacteria, UA RARE (*)    All other components within normal limits  PREGNANCY, URINE  CBG MONITORING, ED  POC URINE PREG, ED     EKG  ED ECG REPORT I, Blake Divine, the attending physician, personally viewed and interpreted this ECG.   Date: 12/28/2022  EKG Time: 10:38  Rate: 129  Rhythm: sinus tachycardia  Axis: RAD  Intervals:none  ST&T Change: Inferior T wave inversions  RADIOLOGY Chest x-ray reviewed and interpreted by me with no infiltrate, edema, or effusion.  PROCEDURES:  Critical Care performed: No  Procedures   MEDICATIONS ORDERED IN ED: Medications  acetaminophen (TYLENOL) tablet 650 mg (0 mg Oral Not Given 12/28/22 1148)  ondansetron (ZOFRAN) injection 4 mg (4 mg Intravenous Given 12/28/22 1213)  lactated ringers bolus 1,000 mL (0 mLs Intravenous Stopped 12/28/22 1355)  ketorolac (TORADOL) 30 MG/ML injection 15 mg (15 mg Intravenous  Given 12/28/22 1257)  acetaminophen (TYLENOL) tablet 325 mg (325 mg Oral Given 12/28/22 1257)     IMPRESSION / MDM / ASSESSMENT AND PLAN / ED COURSE  I reviewed the triage vital signs and the nursing notes.                              47 y.o. female with past medical history of migraines, anxiety, and depression who presents to the ED with 24 hours of sore throat, malaise, nausea, vomiting, cough, and mild difficulty breathing.  Patient's presentation is most consistent with acute presentation with potential threat to life or bodily function.  Differential diagnosis includes, but is not limited to, sepsis, pneumonia, UTI, dehydration, electrode abnormality, AKI, strep pharyngitis, viral pharyngitis, COVID-19, influenza.  Patient uncomfortable appearing but in no acute distress, vital signs remarkable for fever and tachycardia, otherwise reassuring with stable blood pressure.  She is not in any respiratory distress and maintaining oxygen saturations at 95% on room air.  Strep testing performed at the walk-in clinic and is positive, which is likely the source of her fever.  Testing for COVID-19 and influenza were negative, but given her cough and difficulty breathing, we will check chest x-ray.  Urinalysis does not appear concerning for infection, pregnancy testing is negative.  Labs show leukocytosis with no significant anemia, electrolyte abnormality, or AKI.  We will treat fever with Tylenol, hydrate with IV fluids and treat symptomatically with IV Zofran and Toradol.  Chest x-ray is unremarkable, fever and tachycardia are improving, patient reports feeling much better following additional Tylenol, Toradol, and IV fluids.  She is tolerating oral intake without difficulty and is appropriate for discharge home with PCP follow-up.  She was counseled to return to the ED for new or worsening symptoms, patient was with plan.  Patient remains tachycardic and complains of increasing difficulty  swallowing.  Fullness noted in the right peritonsillar area on reexamination.  We will further assess with CT soft tissue neck, patient turned over to oncoming provider pending results.      FINAL CLINICAL IMPRESSION(S) / ED DIAGNOSES   Final diagnoses:  Strep pharyngitis  Generalized weakness  Near syncope     Rx / DC Orders   ED Discharge Orders          Ordered    ondansetron (ZOFRAN-ODT) 4 MG disintegrating tablet  Every 8 hours PRN        12/28/22 1413             Note:  This document was prepared using Dragon voice recognition software and may include unintentional dictation errors.   Blake Divine, MD 12/28/22 1416  Blake Divine, MD 12/28/22 [phone]

## 2022-12-28 NOTE — ED Notes (Addendum)
Pt bent arm with IV, delayed IVF infusion.

## 2022-12-28 NOTE — Discharge Instructions (Addendum)
Salt/water gargles

## 2022-12-29 DIAGNOSIS — J020 Streptococcal pharyngitis: Secondary | ICD-10-CM | POA: Diagnosis not present

## 2022-12-29 LAB — CBC
HCT: 39.9 % (ref 36.0–46.0)
Hemoglobin: 13 g/dL (ref 12.0–15.0)
MCH: 27 pg (ref 26.0–34.0)
MCHC: 32.6 g/dL (ref 30.0–36.0)
MCV: 83 fL (ref 80.0–100.0)
Platelets: 252 10*3/uL (ref 150–400)
RBC: 4.81 MIL/uL (ref 3.87–5.11)
RDW: 14.4 % (ref 11.5–15.5)
WBC: 24.6 10*3/uL — ABNORMAL HIGH (ref 4.0–10.5)
nRBC: 0 % (ref 0.0–0.2)

## 2022-12-29 LAB — HIV ANTIBODY (ROUTINE TESTING W REFLEX): HIV Screen 4th Generation wRfx: NONREACTIVE

## 2022-12-29 LAB — GLUCOSE, CAPILLARY: Glucose-Capillary: 145 mg/dL — ABNORMAL HIGH (ref 70–99)

## 2022-12-29 MED ORDER — CLINDAMYCIN HCL 300 MG PO CAPS: 600.0000 mg | ORAL_CAPSULE | Freq: Three times a day (TID) | ORAL | 0 refills | Status: AC

## 2022-12-29 MED ORDER — MENTHOL 3 MG MT LOZG
1.0000 | LOZENGE | OROMUCOSAL | Status: DC | PRN
  Filled 2022-12-29: qty 9

## 2022-12-29 NOTE — Progress Notes (Signed)
[facility] [place] NAME: Janice [name]    MR#:  [id number]  DATE OF BIRTH:  [birth year]  SUBJECTIVE:  family at bedside. Patient feels a lot better today. Less oral secretions. Able to tolerate clear liquid. Okay to try soft food. No fever. Scratchy throat.    VITALS:  Blood pressure (!) 136/51, pulse 85, temperature 97.7 F (36.5 C), temperature source Oral, resp. rate 14, height 5' (1.524 m), weight 98.4 kg, SpO2 99 %.  PHYSICAL EXAMINATION:   GENERAL:  47 y.o.-year-old patient with no acute distress. Morbid obesity LUNGS: Normal breath sounds bilaterally, no wheezing CARDIOVASCULAR: S1, S2 normal. No murmur   ABDOMEN: Soft, nontender, nondistended. Bowel sounds present.  EXTREMITIES: No  edema b/l.    NEUROLOGIC: nonfocal  patient is alert and awake SKIN: No obvious rash, lesion, or ulcer.   LABORATORY PANEL:  CBC Recent Labs  Lab 12/29/22 0925  WBC 24.6*  HGB 13.0  HCT 39.9  PLT 252    Chemistries  Recent Labs  Lab 12/28/22 1043  NA 135  K 3.7  CL 103  CO2 22  GLUCOSE 146*  BUN 10  CREATININE 0.77  CALCIUM 8.7*   Cardiac Enzymes No results for input(s): "TROPONINI" in the last 168 hours. RADIOLOGY:  CT Soft Tissue Neck W Contrast  Result Date: 12/28/2022 CLINICAL DATA:  Epiglottitis or tonsillitis suspected. Generalized body aches and throat pain. EXAM: CT NECK WITH CONTRAST TECHNIQUE: Multidetector CT imaging of the neck was performed using the standard protocol following the bolus administration of intravenous contrast. RADIATION DOSE REDUCTION: This exam was performed according to the departmental dose-optimization program which includes automated exposure control, adjustment of the mA and/or kV according to patient size and/or use of iterative reconstruction technique. CONTRAST:  136mL OMNIPAQUE IOHEXOL 300 MG/ML  SOLN COMPARISON:  None Available. FINDINGS: Pharynx and larynx: Edema of the  right-greater-than-left palatine tonsils with associated edema of the epiglottis and hypopharynx. The upper airway is narrowed but patent. No organized fluid collection. Salivary glands: No inflammation, mass, or stone. Thyroid: Normal. Lymph nodes: Mildly prominent bilateral level 2 and 3 lymph nodes, likely reactive. Vascular: Unremarkable. Limited intracranial: Unremarkable. Visualized orbits: Normal. Mastoids and visualized paranasal sinuses: Well aerated. Skeleton: No suspicious bone lesions. Upper chest: Unremarkable. Other: None. IMPRESSION: 1. Findings consistent with pharyngitis/tonsillitis with edema of the epiglottis and hypopharynx. The upper airway is narrowed but patent. No organized fluid collection. 2. Mildly prominent bilateral level 2 and 3 lymph nodes, likely reactive. Electronically Signed   By: Emmit Alexanders M.D.   On: 12/28/2022 16:29   DG Chest 2 View  Result Date: 12/28/2022 CLINICAL DATA:  Fever, shortness of breath. EXAM: CHEST - 2 VIEW COMPARISON:  None Available. FINDINGS: The heart size and mediastinal contours are within normal limits. Both lungs are clear. The visualized skeletal structures are unremarkable. IMPRESSION: No active cardiopulmonary disease. Electronically Signed   By: Marijo Conception M.D.   On: 12/28/2022 13:52    Assessment and Plan   Janice [name] is a 47 y.o. female with medical history significant of anxiety/depression on, obesity comes to the emergency room from [facility] clinic after she was found to have high-grade fever of 101.3, tachycardia and sore throat. She tested positive for group A streptococcus throat infection.   Sepsis secondary to group A streptococcus/epiglottis -- admit to telemetry medical -- IV fluids -- NPO accepts ice chips--change to clears and now on soft diet -- IV clindamycin, IV  Decadron -- Tylenol PRN, IV Toradol PRN -- patient was seen by ENT Dr. Richardson Landry. Direct laryngoscope showed bedside fiberoptic scope showing  swelling of the supraglottic space and epiglottis  -- patient is able to hold upper secretions. No respiratory distress. Sats hundred percent on room air --Sepsis improving overall.    Leukocytosis -- due to infection and steroids   Anxiety depression -- resume home meds when able to swallow  If tolerates soft diet will consider d/c later today or tomorrow         Advance Care Planning:   Code Status: Full Code d/w pt   Consults: ENT dr Richardson Landry   Family Communication: family at bedside DVT Prophylaxis :Enoxaparin Level of care: Telemetry Medical Status is: Inpatient Remains inpatient appropriate because: awaitng to see if she tolerates po diet    TOTAL TIME TAKING CARE OF THIS PATIENT: 35 minutes.  >50% time spent on counselling and coordination of care  Note: This dictation was prepared with Dragon dictation along with smaller phrase technology. Any transcriptional errors that result from this process are unintentional.  Fritzi Mandes M.D    [facility]   CC: Primary care physician; Rusty Aus, MD

## 2022-12-29 NOTE — Discharge Summary (Signed)
Physician Discharge Summary   Patient: Janice [name] MRN: [id number] DOB: 03-Aug-1976  Admit date:     12/28/2022  Discharge date: 12/29/22  Discharge Physician: Fritzi Mandes   PCP: Rusty Aus, MD   Recommendations at discharge:    F/u ENT dr Richardson Landry in 10 days   Discharge Diagnoses: Principal Problem:   Streptococcal pharyngitis Active Problems:   Sepsis due to group A Streptococcus without acute organ dysfunction [facility])   Generalized weakness  Janice [name] is a 47 y.o. female with medical history significant of anxiety/depression on, obesity comes to the emergency room from [facility] clinic after she was found to have high-grade fever of 101.3, tachycardia and sore throat. She tested positive for group A streptococcus throat infection.   Sepsis secondary to group A streptococcus/epiglottis/Pharyngitis -- admit to telemetry medical -- IV fluids -- NPO accepts ice chips--change to clears and now on soft diet -- IV clindamycin, IV Decadron--change to po abxs -- Tylenol PRN, IV Toradol PRN -- patient was seen by ENT Dr. Richardson Landry. Direct laryngoscope showed bedside fiberoptic scope showing swelling of the supraglottic space and epiglottis  -- patient is able to hold upper secretions. No respiratory distress. Sats hundred percent on room air --Sepsis improving overall.    Leukocytosis -- due to infection and steroids   Anxiety depression -- resume home meds when able to swallow   Pt tolerated soft diet and ok to go home     Consultants: ENT Disposition: Home Diet recommendation:  Discharge Diet Orders (From admission, onward)     Start     Ordered   12/29/22 0000  Diet general        12/29/22 1744           Regular diet DISCHARGE MEDICATION: Allergies as of 12/29/2022       Reactions   Penicillins Swelling   Has patient had a PCN reaction causing immediate rash, facial/tongue/throat swelling, SOB or lightheadedness with hypotension: No Has patient had a PCN  reaction causing severe rash involving mucus membranes or skin necrosis: Yes Has patient had a PCN reaction that required hospitalization: No Has patient had a PCN reaction occurring within the last 10 years: No If all of the above answers are "NO", then may proceed with Cephalosporin use.        Medication List     STOP taking these medications    norethindrone-ethinyl estradiol-FE 1-20 MG-MCG tablet Commonly known as: LOESTRIN FE       TAKE these medications    albuterol 108 (90 Base) MCG/ACT inhaler Commonly known as: VENTOLIN HFA Inhale 2 puffs into the lungs every 6 (six) hours as needed for wheezing or shortness of breath.   cholecalciferol 1000 units tablet Commonly known as: VITAMIN D Take 1,000 Units by mouth daily.   clindamycin 300 MG capsule Commonly known as: CLEOCIN Take 2 capsules (600 mg total) by mouth 3 (three) times daily for 6 days. What changed: how much to take   desvenlafaxine 50 MG 24 hr tablet Commonly known as: PRISTIQ Take 50 mg by mouth daily. What changed: Another medication with the same name was removed. Continue taking this medication, and follow the directions you see here.   guaiFENesin-codeine 100-10 MG/5ML syrup Take 5 mLs by mouth every 6 (six) hours as needed for cough.   ondansetron 4 MG disintegrating tablet Commonly known as: ZOFRAN-ODT Take 1 tablet (4 mg total) by mouth every 8 (eight) hours as needed for nausea or vomiting.   predniSONE 20  MG tablet Commonly known as: DELTASONE Take 20 mg by mouth 2 (two) times daily with a meal. For 5 days   valACYclovir 1000 MG tablet Commonly known as: VALTREX Take 1,000 mg by mouth 2 (two) times daily. prn   Vitamin D (Ergocalciferol) 50000 units Caps Take 1 capsule by mouth once a week.        Follow-up Information     [facility] Emergency Department at [facility] .   Specialty: Emergency Medicine Why: If symptoms worsen Contact information: [place] [id number] [place]        Rusty Aus, MD. Schedule an appointment as soon as possible for a visit .   Specialty: Internal Medicine Contact information: [place] [address] [phone]         Clyde Canterbury, MD Follow up.   Specialty: Otolaryngology Why: strep epiglotittis Contact information: [place] [address] [phone]                 Filed Weights   12/28/22 1034  Weight: 98.4 kg     Condition at discharge: fair  The results of significant diagnostics from this hospitalization (including imaging, microbiology, ancillary and laboratory) are listed below for reference.   Imaging Studies: CT Soft Tissue Neck W Contrast  Result Date: 12/28/2022 CLINICAL DATA:  Epiglottitis or tonsillitis suspected. Generalized body aches and throat pain. EXAM: CT NECK WITH CONTRAST TECHNIQUE: Multidetector CT imaging of the neck was performed using the standard protocol following the bolus administration of intravenous contrast. RADIATION DOSE REDUCTION: This exam was performed according to the departmental dose-optimization program which includes automated exposure control, adjustment of the mA and/or kV according to patient size and/or use of iterative reconstruction technique. CONTRAST:  126mL OMNIPAQUE IOHEXOL 300 MG/ML  SOLN COMPARISON:  None Available. FINDINGS: Pharynx and larynx: Edema of the right-greater-than-left palatine tonsils with associated edema of the epiglottis and hypopharynx. The upper airway is narrowed but patent. No organized fluid collection. Salivary glands: No inflammation, mass, or stone. Thyroid: Normal. Lymph nodes: Mildly prominent bilateral level 2 and 3 lymph nodes, likely reactive. Vascular: Unremarkable. Limited intracranial: Unremarkable. Visualized orbits: Normal. Mastoids and visualized paranasal  sinuses: Well aerated. Skeleton: No suspicious bone lesions. Upper chest: Unremarkable. Other: None. IMPRESSION: 1. Findings consistent with pharyngitis/tonsillitis with edema of the epiglottis and hypopharynx. The upper airway is narrowed but patent. No organized fluid collection. 2. Mildly prominent bilateral level 2 and 3 lymph nodes, likely reactive. Electronically Signed   By: Emmit Alexanders M.D.   On: 12/28/2022 16:29   DG Chest 2 View  Result Date: 12/28/2022 CLINICAL DATA:  Fever, shortness of breath. EXAM: CHEST - 2 VIEW COMPARISON:  None Available. FINDINGS: The heart size and mediastinal contours are within normal limits. Both lungs are clear. The visualized skeletal structures are unremarkable. IMPRESSION: No active cardiopulmonary disease. Electronically Signed   By: Marijo Conception M.D.   On: 12/28/2022 13:52   MM 3D SCREEN BREAST BILATERAL  Result Date: 12/12/2022 CLINICAL DATA:  Screening. EXAM: DIGITAL SCREENING BILATERAL MAMMOGRAM WITH TOMOSYNTHESIS AND CAD TECHNIQUE: Bilateral screening digital craniocaudal and mediolateral oblique mammograms were obtained. Bilateral screening digital breast tomosynthesis was performed. The images were evaluated with computer-aided detection. COMPARISON:  Previous exam(s). ACR Breast Density Category b: There are scattered areas of fibroglandular density. FINDINGS: There are no findings suspicious for malignancy. IMPRESSION: No mammographic evidence of malignancy. A  result letter of this screening mammogram will be mailed directly to the patient. RECOMMENDATION: Screening mammogram in one year. (Code:SM-B-01Y) BI-RADS CATEGORY  1: Negative. Electronically Signed   By: Valentino Saxon M.D.   On: 12/12/2022 14:37    Microbiology: No results found for this or any previous visit.  Labs: CBC: Recent Labs  Lab 12/28/22 1043 12/29/22 0925  WBC 21.0* 24.6*  HGB 13.7 13.0  HCT 42.0 39.9  MCV 83.7 83.0  PLT 263 [id number]   Basic Metabolic  Panel: Recent Labs  Lab 12/28/22 1043  NA 135  K 3.7  CL 103  CO2 22  GLUCOSE 146*  BUN 10  CREATININE 0.77  CALCIUM 8.7*   Liver Function Tests: No results for input(s): "AST", "ALT", "ALKPHOS", "BILITOT", "PROT", "ALBUMIN" in the last 168 hours. CBG: Recent Labs  Lab 12/29/22 0617  GLUCAP 145*    Discharge time spent: greater than 30 minutes.  Signed: Fritzi Mandes, MD [facility] 12/29/2022

## 2022-12-29 NOTE — TOC Initial Note (Signed)
Transition of Care [facility]) - Initial/Assessment Note    Patient Details  Name: Janice [name] MRN: [id number] Date of Birth: [birth year]  Transition of Care [facility]) CM/SW Contact:    Beverly Sessions, RN Phone Number: 12/29/2022, 10:50 AM  Clinical Narrative:                       Transition of Care [facility]) Screening Note   Patient Details  Name: Janice [name] Date of Birth: Apr 26, 1976   Transition of Care [facility]) CM/SW Contact:    Beverly Sessions, RN Phone Number: 12/29/2022, 10:50 AM    Transition of Care Department [facility]) has reviewed patient and no TOC needs have been identified at this time. We will continue to monitor patient advancement through interdisciplinary progression rounds. If new patient transition needs arise, please place a TOC consult.     Patient Goals and CMS Choice            Expected Discharge Plan and Services                                              Prior Living Arrangements/Services                       Activities of Daily Living Home Assistive Devices/Equipment: None ADL Screening (condition at time of admission) Patient's cognitive ability adequate to safely complete daily activities?: Yes Is the patient deaf or have difficulty hearing?: No Does the patient have difficulty seeing, even when wearing glasses/contacts?: No Does the patient have difficulty concentrating, remembering, or making decisions?: No Patient able to express need for assistance with ADLs?: Yes Does the patient have difficulty dressing or bathing?: No Independently performs ADLs?: Yes (appropriate for developmental age) Does the patient have difficulty walking or climbing stairs?: No Weakness of Legs: None Weakness of Arms/Hands: None  Permission Sought/Granted                  Emotional Assessment              Admission diagnosis:  Streptococcal pharyngitis [J02.0] Generalized weakness [R53.1] Near syncope [R55] Strep  pharyngitis [J02.0] Patient Active Problem List   Diagnosis Date Noted   Streptococcal pharyngitis 12/28/2022   Sepsis due to group A Streptococcus without acute organ dysfunction ([place]) 12/28/2022   Generalized weakness 12/28/2022   PCP:  Rusty Aus, MD Pharmacy:   [place], [place] - [address] [place] [address] Phone: [phone] Fax: [phone]     Social Determinants of Health (SDOH) Social History: SDOH Screenings   Food Insecurity: No Food Insecurity (12/28/2022)  Housing: Low Risk  (12/28/2022)  Transportation Needs: No Transportation Needs (12/28/2022)  Utilities: Not At Risk (12/28/2022)  Tobacco Use: Low Risk  (12/28/2022)   SDOH Interventions:     Readmission Risk Interventions     No data to display

## 2022-12-29 NOTE — Progress Notes (Signed)
Pt discharged per MD order. IV removed. Discharge instructions reviewed with pt. Pt verbalized understanding. All questions answered to pt satisfaction. Pt taken out in wheelchair by staff.  

## 2022-12-29 NOTE — Progress Notes (Signed)
Patient ID: Janice Janice [name], female   DOB: [name], 47 y.o.   MRN: Janice Janice [name] Janice, Janice [name] Janice Janice [name] Janice Janice [name] Janice Nearing, MD   SUBJECTIVE: This 47 y.o. year old female is status post No admission procedures for hospital encounter..  Patient notes improvement in throat pain overnight and is now able to swallow liquids and was able to swallow pills.  She feels comfortable with trying soft foods this afternoon.  She had some fever last night but afebrile today.  Lymph node swelling also feels better although still a little tender.  Medications:  Current Facility-Administered Medications  Medication Dose Route Frequency Provider Last Rate Last Admin   acetaminophen (TYLENOL) tablet 650 mg  650 mg Oral Q6H PRN Fritzi Mandes, MD   650 mg at 12/29/22 [id number]   Or   acetaminophen (TYLENOL) suppository 650 mg  650 mg Rectal Q6H PRN Fritzi Mandes, MD   650 mg at 12/28/22 1949   clindamycin (CLEOCIN) IVPB 600 mg  600 mg Intravenous Selena Lesser, MD 100 mL/hr at 12/29/22 0521 600 mg at 12/29/22 0521   dexamethasone (DECADRON) injection 4 mg  4 mg Intravenous Q6H Fritzi Mandes, MD   4 mg at 12/29/22 0522   enoxaparin (LOVENOX) injection 50 mg  50 mg Subcutaneous Q24H Fritzi Mandes, MD   50 mg at 12/28/22 2214   ketorolac (TORADOL) 30 MG/ML injection 30 mg  30 mg Intravenous Q6H PRN Fritzi Mandes, MD   30 mg at 12/29/22 [id number]   menthol-cetylpyridinium (CEPACOL) lozenge 3 mg  1 lozenge Oral PRN Fritzi Mandes, MD       ondansetron [facility]) tablet 4 mg  4 mg Oral Q6H PRN Fritzi Mandes, MD       Or   ondansetron [facility]) injection 4 mg  4 mg Intravenous Q6H PRN Fritzi Mandes, MD       oxyCODONE (Oxy IR/ROXICODONE) immediate release tablet 5 mg  5 mg Oral Q4H PRN Fritzi Mandes, MD      .  Medications Prior to Admission  Medication Sig Dispense Refill   albuterol (VENTOLIN HFA) 108 (90 Base) MCG/ACT inhaler Inhale 2 puffs into the lungs every 6 (six) hours as needed for wheezing or shortness of breath.      cholecalciferol (VITAMIN D) 1000 units tablet Take 1,000 Units by mouth daily.     clindamycin (CLEOCIN) 300 MG capsule Take 300 mg by mouth 3 (three) times daily.     desvenlafaxine (PRISTIQ) 50 MG 24 hr tablet Take 50 mg by mouth daily.     guaiFENesin-codeine 100-10 MG/5ML syrup Take 5 mLs by mouth every 6 (six) hours as needed for cough.     predniSONE (DELTASONE) 20 MG tablet Take 20 mg by mouth 2 (two) times daily with a meal. For 5 days     valACYclovir (VALTREX) 1000 MG tablet Take 1,000 mg by mouth 2 (two) times daily. prn     Vitamin D, Ergocalciferol, 50000 units CAPS Take 1 capsule by mouth once a week.     desvenlafaxine (PRISTIQ) 100 MG 24 hr tablet Take 100 mg by mouth daily. (Patient not taking: Reported on 12/28/2022)     norethindrone-ethinyl estradiol (JUNEL FE,GILDESS FE,LOESTRIN FE) 1-20 MG-MCG tablet Take 1 tablet by mouth daily. (Patient not taking: Reported on 12/28/2022)      OBJECTIVE:  PHYSICAL EXAM  Vitals: Blood pressure (!) 136/51, pulse 85, temperature 97.7 F (36.5 C), temperature source Oral, resp. rate 14, height 5' (1.524 m), weight 98.4 kg, SpO2 99 %.. General: Well-developed,  Well-nourished in no acute distress Mood: Mood and affect well adjusted, pleasant and cooperative. Orientation: Grossly alert and oriented. Vocal Quality: No hoarseness. Communicates verbally. head and Face: NCAT. No facial asymmetry. No visible skin lesions. No significant facial scars. No tenderness with sinus percussion. Facial strength normal and symmetric. Oral Cavity/ Oropharynx: Lips are normal with no lesions. Teeth no frank dental caries. Gingiva healthy with no lesions or gingivitis. Oropharynx including tongue, buccal mucosa, floor of mouth, hard and soft palate, uvula and posterior pharynx free of exudates, minimal erythema or lesions with normal symmetry and hydration.  Neck: Supple and symmetric with improved swelling of left jugulodigastric node, still slightly tender but  without fluctuance. Respiratory: Normal respiratory effort without labored breathing.  MEDICAL DECISION MAKING: Data Review:  Results for orders placed or performed during the hospital encounter of 12/28/22 (from the past 48 hour(s))  Basic metabolic panel     Status: Abnormal   Collection Time: 12/28/22 10:43 AM  Result Value Ref Range   Sodium 135 135 - 145 mmol/L   Potassium 3.7 3.5 - 5.1 mmol/L   Chloride 103 98 - 111 mmol/L   CO2 22 22 - 32 mmol/L   Glucose, Bld 146 (H) 70 - 99 mg/dL    Comment: Glucose reference range applies only to samples taken after fasting for at least 8 hours.   BUN 10 6 - 20 mg/dL   Creatinine, Ser 0.77 0.44 - 1.00 mg/dL   Calcium 8.7 (L) 8.9 - 10.3 mg/dL   GFR, Estimated >60 >60 mL/min    Comment: (NOTE) Calculated using the CKD-EPI Creatinine Equation (2021)    Anion gap 10 5 - 15    Comment: Performed at [facility], [address] [place], [place] [address]  CBC     Status: Abnormal   Collection Time: 12/28/22 10:43 AM  Result Value Ref Range   WBC 21.0 (H) 4.0 - 10.5 K/uL   RBC 5.02 3.87 - 5.11 MIL/uL   Hemoglobin 13.7 12.0 - 15.0 g/dL   HCT 42.0 36.0 - 46.0 %   MCV 83.7 80.0 - 100.0 fL   MCH 27.3 26.0 - 34.0 pg   MCHC 32.6 30.0 - 36.0 g/dL   RDW 14.2 11.5 - 15.5 %   Platelets 263 150 - 400 K/uL   nRBC 0.0 0.0 - 0.2 %    Comment: Performed at [facility], [address] [place], [place] [address]  Urinalysis, Routine w reflex microscopic -Urine, Clean Catch     Status: Abnormal   Collection Time: 12/28/22 11:48 AM  Result Value Ref Range   Color, Urine YELLOW YELLOW   APPearance CLEAR (A) CLEAR   Specific Gravity, Urine 1.020 1.005 - 1.030   pH 7.5 5.0 - 8.0   Glucose, UA NEGATIVE NEGATIVE mg/dL   Hgb urine dipstick MODERATE (A) NEGATIVE   Bilirubin Urine NEGATIVE NEGATIVE   Ketones, ur NEGATIVE NEGATIVE mg/dL   Protein, ur 100 (A) NEGATIVE mg/dL   Nitrite NEGATIVE NEGATIVE   Leukocytes,Ua NEGATIVE NEGATIVE    RBC / HPF 21-50 0 - 5 RBC/hpf   WBC, UA 0-5 0 - 5 WBC/hpf   Bacteria, UA RARE (A) NONE SEEN   Squamous Epithelial / HPF 0-5 0 - 5 /HPF   Mucus PRESENT    Hyaline Casts, UA PRESENT     Comment: Performed at [facility], [address] [place], [place] [address]  Pregnancy, urine     Status: None   Collection Time:  12/28/22 11:48 AM  Result Value Ref Range   Preg Test, Ur NEGATIVE NEGATIVE    Comment: Performed at [facility], [address] [place], [place] [address]  Glucose, capillary     Status: Abnormal   Collection Time: 12/29/22  6:17 AM  Result Value Ref Range   Glucose-Capillary 145 (H) 70 - 99 mg/dL    Comment: Glucose reference range applies only to samples taken after fasting for at least 8 hours.  CBC     Status: Abnormal   Collection Time: 12/29/22  9:25 AM  Result Value Ref Range   WBC 24.6 (H) 4.0 - 10.5 K/uL   RBC 4.81 3.87 - 5.11 MIL/uL   Hemoglobin 13.0 12.0 - 15.0 g/dL   HCT 39.9 36.0 - 46.0 %   MCV 83.0 80.0 - 100.0 fL   MCH 27.0 26.0 - 34.0 pg   MCHC 32.6 30.0 - 36.0 g/dL   RDW 14.4 11.5 - 15.5 %   Platelets 252 150 - 400 K/uL   nRBC 0.0 0.0 - 0.2 %    Comment: Performed at [facility], [address] [place], [place] [address]  . CT Soft Tissue Neck W Contrast  Result Date: 12/28/2022 CLINICAL DATA:  Epiglottitis or tonsillitis suspected. Generalized body aches and throat pain. EXAM: CT NECK WITH CONTRAST TECHNIQUE: Multidetector CT imaging of the neck was performed using the standard protocol following the bolus administration of intravenous contrast. RADIATION DOSE REDUCTION: This exam was performed according to the departmental dose-optimization program which includes automated exposure control, adjustment of the mA and/or kV according to patient size and/or use of iterative reconstruction technique. CONTRAST:  131mL OMNIPAQUE IOHEXOL 300 MG/ML  SOLN COMPARISON:  None Available. FINDINGS: Pharynx and larynx: Edema of the  right-greater-than-left palatine tonsils with associated edema of the epiglottis and hypopharynx. The upper airway is narrowed but patent. No organized fluid collection. Salivary glands: No inflammation, mass, or stone. Thyroid: Normal. Lymph nodes: Mildly prominent bilateral level 2 and 3 lymph nodes, likely reactive. Vascular: Unremarkable. Limited intracranial: Unremarkable. Visualized orbits: Normal. Mastoids and visualized paranasal sinuses: Well aerated. Skeleton: No suspicious bone lesions. Upper chest: Unremarkable. Other: None. IMPRESSION: 1. Findings consistent with pharyngitis/tonsillitis with edema of the epiglottis and hypopharynx. The upper airway is narrowed but patent. No organized fluid collection. 2. Mildly prominent bilateral level 2 and 3 lymph nodes, likely reactive. Electronically Signed   By: Emmit Alexanders M.D.   On: 12/28/2022 16:29   DG Chest 2 View  Result Date: 12/28/2022 CLINICAL DATA:  Fever, shortness of breath. EXAM: CHEST - 2 VIEW COMPARISON:  None Available. FINDINGS: The heart size and mediastinal contours are within normal limits. Both lungs are clear. The visualized skeletal structures are unremarkable. IMPRESSION: No active cardiopulmonary disease. Electronically Signed   By: Marijo Conception M.D.   On: 12/28/2022 13:52  .   ASSESSMENT: Considerable improvement in pharyngitis/supraglottitis.  Scope exam deferred given the fact that she has continued to improve and is now swallowing pills and liquids. PLAN: Agree with proposed plan that she be discharged once tolerating soft foods.  Clindamycin should provide good outpatient coverage along with continuing a prednisone taper to address residual swelling.  I am happy to see her back in the office for follow-up in the next week or 2.   Janice Nearing, MD 12/29/2022 11:27 AM

## 2024-07-07 ENCOUNTER — Other Ambulatory Visit: Payer: Self-pay | Admitting: Internal Medicine

## 2024-07-07 DIAGNOSIS — Z1231 Encounter for screening mammogram for malignant neoplasm of breast: Secondary | ICD-10-CM

## 2024-07-28 ENCOUNTER — Ambulatory Visit
Admission: RE | Admit: 2024-07-28 | Discharge: 2024-07-28 | Disposition: A | Discharge status: Home / Self Care | Payer: Self-pay | Source: Ambulatory Visit | Attending: Internal Medicine | Admitting: Internal Medicine

## 2024-07-28 DIAGNOSIS — Z1231 Encounter for screening mammogram for malignant neoplasm of breast: Secondary | ICD-10-CM | POA: Insufficient documentation
